# Patient Record
Sex: Male | Born: 1970
Health system: Southern US, Community
[De-identification: ages and names within clinical notes are randomized; demographics above are authoritative.]

## PROBLEM LIST (undated history)

## (undated) DIAGNOSIS — M3130 Wegener's granulomatosis without renal involvement: Secondary | ICD-10-CM

## (undated) DIAGNOSIS — I499 Cardiac arrhythmia, unspecified: Secondary | ICD-10-CM

## (undated) DIAGNOSIS — Z6828 Body mass index (BMI) 28.0-28.9, adult: Secondary | ICD-10-CM

## (undated) DIAGNOSIS — R519 Headache, unspecified: Secondary | ICD-10-CM

## (undated) HISTORY — PX: LUNG BIOPSY: SHX232

## (undated) HISTORY — DX: Body mass index (BMI) 28.0-28.9, adult: Z68.28

## (undated) HISTORY — PX: WISDOM TOOTH EXTRACTION: SHX21

---

## 2015-03-25 DIAGNOSIS — H61002 Unspecified perichondritis of left external ear: Secondary | ICD-10-CM | POA: Diagnosis not present

## 2015-05-09 MED FILL — CHLORHEXIDINE 0.12% RINSE: 0.12 | 15 days supply | Qty: 473 | Fill #0

## 2015-05-09 MED FILL — AMOXICILLIN 500 MG CAPSULE: 500 | 7 days supply | Qty: 21 | Fill #0

## 2015-05-09 MED FILL — HYDROCODON-APAP 7.5-325: 7.5-325 | 3 days supply | Qty: 12 | Fill #0

## 2015-06-09 DIAGNOSIS — H524 Presbyopia: Secondary | ICD-10-CM | POA: Diagnosis not present

## 2015-06-23 DIAGNOSIS — Z125 Encounter for screening for malignant neoplasm of prostate: Secondary | ICD-10-CM | POA: Diagnosis not present

## 2015-06-23 DIAGNOSIS — E538 Deficiency of other specified B group vitamins: Secondary | ICD-10-CM | POA: Diagnosis not present

## 2015-06-23 DIAGNOSIS — Z0000 Encounter for general adult medical examination without abnormal findings: Secondary | ICD-10-CM | POA: Diagnosis not present

## 2015-06-24 DIAGNOSIS — J301 Allergic rhinitis due to pollen: Secondary | ICD-10-CM | POA: Diagnosis not present

## 2015-06-24 DIAGNOSIS — J3081 Allergic rhinitis due to animal (cat) (dog) hair and dander: Secondary | ICD-10-CM | POA: Diagnosis not present

## 2015-06-24 DIAGNOSIS — J3089 Other allergic rhinitis: Secondary | ICD-10-CM | POA: Diagnosis not present

## 2015-06-24 DIAGNOSIS — R05 Cough: Secondary | ICD-10-CM | POA: Diagnosis not present

## 2015-06-24 MED FILL — LEVOCETIRIZINE 5 MG TABLET: 5 | 30 days supply | Qty: 30 | Fill #0

## 2015-06-24 MED FILL — MOMETASONE FUROATE 0.1% CRM: 0.1 | 30 days supply | Qty: 90 | Fill #0

## 2015-06-24 MED FILL — FLUTICASONE PROP 50 MCG SPR: 50 | 30 days supply | Qty: 16 | Fill #0

## 2015-07-03 MED FILL — EPINEPHRINE 0.3 MG AUTO-INJ: 0.3 | 20 days supply | Qty: 2 | Fill #0

## 2015-07-09 DIAGNOSIS — H40003 Preglaucoma, unspecified, bilateral: Secondary | ICD-10-CM | POA: Diagnosis not present

## 2015-07-15 DIAGNOSIS — J3089 Other allergic rhinitis: Secondary | ICD-10-CM | POA: Diagnosis not present

## 2015-07-15 DIAGNOSIS — J3081 Allergic rhinitis due to animal (cat) (dog) hair and dander: Secondary | ICD-10-CM | POA: Diagnosis not present

## 2015-07-15 DIAGNOSIS — J301 Allergic rhinitis due to pollen: Secondary | ICD-10-CM | POA: Diagnosis not present

## 2015-07-22 DIAGNOSIS — J3081 Allergic rhinitis due to animal (cat) (dog) hair and dander: Secondary | ICD-10-CM | POA: Diagnosis not present

## 2015-07-22 DIAGNOSIS — J301 Allergic rhinitis due to pollen: Secondary | ICD-10-CM | POA: Diagnosis not present

## 2015-07-22 DIAGNOSIS — J3089 Other allergic rhinitis: Secondary | ICD-10-CM | POA: Diagnosis not present

## 2015-07-25 DIAGNOSIS — J301 Allergic rhinitis due to pollen: Secondary | ICD-10-CM | POA: Diagnosis not present

## 2015-07-25 DIAGNOSIS — J3089 Other allergic rhinitis: Secondary | ICD-10-CM | POA: Diagnosis not present

## 2015-07-25 DIAGNOSIS — J3081 Allergic rhinitis due to animal (cat) (dog) hair and dander: Secondary | ICD-10-CM | POA: Diagnosis not present

## 2015-07-29 DIAGNOSIS — J301 Allergic rhinitis due to pollen: Secondary | ICD-10-CM | POA: Diagnosis not present

## 2015-07-29 DIAGNOSIS — J3089 Other allergic rhinitis: Secondary | ICD-10-CM | POA: Diagnosis not present

## 2015-07-29 DIAGNOSIS — J3081 Allergic rhinitis due to animal (cat) (dog) hair and dander: Secondary | ICD-10-CM | POA: Diagnosis not present

## 2015-07-31 DIAGNOSIS — J3081 Allergic rhinitis due to animal (cat) (dog) hair and dander: Secondary | ICD-10-CM | POA: Diagnosis not present

## 2015-07-31 DIAGNOSIS — J3089 Other allergic rhinitis: Secondary | ICD-10-CM | POA: Diagnosis not present

## 2015-07-31 DIAGNOSIS — J301 Allergic rhinitis due to pollen: Secondary | ICD-10-CM | POA: Diagnosis not present

## 2015-08-06 DIAGNOSIS — J301 Allergic rhinitis due to pollen: Secondary | ICD-10-CM | POA: Diagnosis not present

## 2015-08-06 DIAGNOSIS — J3089 Other allergic rhinitis: Secondary | ICD-10-CM | POA: Diagnosis not present

## 2015-08-06 DIAGNOSIS — J3081 Allergic rhinitis due to animal (cat) (dog) hair and dander: Secondary | ICD-10-CM | POA: Diagnosis not present

## 2015-08-13 DIAGNOSIS — J3081 Allergic rhinitis due to animal (cat) (dog) hair and dander: Secondary | ICD-10-CM | POA: Diagnosis not present

## 2015-08-13 DIAGNOSIS — J301 Allergic rhinitis due to pollen: Secondary | ICD-10-CM | POA: Diagnosis not present

## 2015-08-13 DIAGNOSIS — J3089 Other allergic rhinitis: Secondary | ICD-10-CM | POA: Diagnosis not present

## 2015-08-20 DIAGNOSIS — J301 Allergic rhinitis due to pollen: Secondary | ICD-10-CM | POA: Diagnosis not present

## 2015-08-20 DIAGNOSIS — J3089 Other allergic rhinitis: Secondary | ICD-10-CM | POA: Diagnosis not present

## 2015-08-20 DIAGNOSIS — J3081 Allergic rhinitis due to animal (cat) (dog) hair and dander: Secondary | ICD-10-CM | POA: Diagnosis not present

## 2015-08-21 DIAGNOSIS — G5600 Carpal tunnel syndrome, unspecified upper limb: Secondary | ICD-10-CM | POA: Diagnosis not present

## 2015-08-25 MED FILL — SERTRALINE HCL 50 MG TABLET: 50 | 90 days supply | Qty: 90 | Fill #0

## 2015-08-25 MED FILL — ETODOLAC 400 MG TABLET: 400 | 30 days supply | Qty: 60 | Fill #0

## 2015-08-27 DIAGNOSIS — J301 Allergic rhinitis due to pollen: Secondary | ICD-10-CM | POA: Diagnosis not present

## 2015-08-27 DIAGNOSIS — J3089 Other allergic rhinitis: Secondary | ICD-10-CM | POA: Diagnosis not present

## 2015-08-27 DIAGNOSIS — J3081 Allergic rhinitis due to animal (cat) (dog) hair and dander: Secondary | ICD-10-CM | POA: Diagnosis not present

## 2015-09-03 DIAGNOSIS — J3089 Other allergic rhinitis: Secondary | ICD-10-CM | POA: Diagnosis not present

## 2015-09-03 DIAGNOSIS — J3081 Allergic rhinitis due to animal (cat) (dog) hair and dander: Secondary | ICD-10-CM | POA: Diagnosis not present

## 2015-09-03 DIAGNOSIS — J301 Allergic rhinitis due to pollen: Secondary | ICD-10-CM | POA: Diagnosis not present

## 2015-09-10 DIAGNOSIS — J301 Allergic rhinitis due to pollen: Secondary | ICD-10-CM | POA: Diagnosis not present

## 2015-09-10 DIAGNOSIS — J3081 Allergic rhinitis due to animal (cat) (dog) hair and dander: Secondary | ICD-10-CM | POA: Diagnosis not present

## 2015-09-10 DIAGNOSIS — J3089 Other allergic rhinitis: Secondary | ICD-10-CM | POA: Diagnosis not present

## 2015-09-11 MED FILL — FLUTICASONE PROP 50 MCG SPR: 50 | 30 days supply | Qty: 16 | Fill #1

## 2015-09-16 DIAGNOSIS — J3081 Allergic rhinitis due to animal (cat) (dog) hair and dander: Secondary | ICD-10-CM | POA: Diagnosis not present

## 2015-09-16 DIAGNOSIS — J3089 Other allergic rhinitis: Secondary | ICD-10-CM | POA: Diagnosis not present

## 2015-09-16 DIAGNOSIS — J301 Allergic rhinitis due to pollen: Secondary | ICD-10-CM | POA: Diagnosis not present

## 2015-09-19 DIAGNOSIS — J3081 Allergic rhinitis due to animal (cat) (dog) hair and dander: Secondary | ICD-10-CM | POA: Diagnosis not present

## 2015-09-19 DIAGNOSIS — J3089 Other allergic rhinitis: Secondary | ICD-10-CM | POA: Diagnosis not present

## 2015-09-19 DIAGNOSIS — J301 Allergic rhinitis due to pollen: Secondary | ICD-10-CM | POA: Diagnosis not present

## 2015-09-23 DIAGNOSIS — J301 Allergic rhinitis due to pollen: Secondary | ICD-10-CM | POA: Diagnosis not present

## 2015-09-23 DIAGNOSIS — J3089 Other allergic rhinitis: Secondary | ICD-10-CM | POA: Diagnosis not present

## 2015-09-23 DIAGNOSIS — J3081 Allergic rhinitis due to animal (cat) (dog) hair and dander: Secondary | ICD-10-CM | POA: Diagnosis not present

## 2015-09-26 DIAGNOSIS — J3089 Other allergic rhinitis: Secondary | ICD-10-CM | POA: Diagnosis not present

## 2015-09-26 DIAGNOSIS — J301 Allergic rhinitis due to pollen: Secondary | ICD-10-CM | POA: Diagnosis not present

## 2015-09-26 DIAGNOSIS — J3081 Allergic rhinitis due to animal (cat) (dog) hair and dander: Secondary | ICD-10-CM | POA: Diagnosis not present

## 2015-09-30 DIAGNOSIS — J3089 Other allergic rhinitis: Secondary | ICD-10-CM | POA: Diagnosis not present

## 2015-09-30 DIAGNOSIS — J3081 Allergic rhinitis due to animal (cat) (dog) hair and dander: Secondary | ICD-10-CM | POA: Diagnosis not present

## 2015-09-30 DIAGNOSIS — J301 Allergic rhinitis due to pollen: Secondary | ICD-10-CM | POA: Diagnosis not present

## 2015-10-02 DIAGNOSIS — J3089 Other allergic rhinitis: Secondary | ICD-10-CM | POA: Diagnosis not present

## 2015-10-02 DIAGNOSIS — J301 Allergic rhinitis due to pollen: Secondary | ICD-10-CM | POA: Diagnosis not present

## 2015-10-02 DIAGNOSIS — J3081 Allergic rhinitis due to animal (cat) (dog) hair and dander: Secondary | ICD-10-CM | POA: Diagnosis not present

## 2015-10-09 DIAGNOSIS — J3081 Allergic rhinitis due to animal (cat) (dog) hair and dander: Secondary | ICD-10-CM | POA: Diagnosis not present

## 2015-10-09 DIAGNOSIS — J3089 Other allergic rhinitis: Secondary | ICD-10-CM | POA: Diagnosis not present

## 2015-10-09 DIAGNOSIS — J301 Allergic rhinitis due to pollen: Secondary | ICD-10-CM | POA: Diagnosis not present

## 2015-10-17 DIAGNOSIS — J301 Allergic rhinitis due to pollen: Secondary | ICD-10-CM | POA: Diagnosis not present

## 2015-10-17 DIAGNOSIS — J3089 Other allergic rhinitis: Secondary | ICD-10-CM | POA: Diagnosis not present

## 2015-10-20 DIAGNOSIS — J3089 Other allergic rhinitis: Secondary | ICD-10-CM | POA: Diagnosis not present

## 2015-10-20 DIAGNOSIS — J301 Allergic rhinitis due to pollen: Secondary | ICD-10-CM | POA: Diagnosis not present

## 2015-10-20 DIAGNOSIS — J3081 Allergic rhinitis due to animal (cat) (dog) hair and dander: Secondary | ICD-10-CM | POA: Diagnosis not present

## 2015-10-22 DIAGNOSIS — J3089 Other allergic rhinitis: Secondary | ICD-10-CM | POA: Diagnosis not present

## 2015-10-22 DIAGNOSIS — J3081 Allergic rhinitis due to animal (cat) (dog) hair and dander: Secondary | ICD-10-CM | POA: Diagnosis not present

## 2015-10-22 DIAGNOSIS — J301 Allergic rhinitis due to pollen: Secondary | ICD-10-CM | POA: Diagnosis not present

## 2015-10-28 MED FILL — FLUTICASONE PROP 50 MCG SPR: 50 | 60 days supply | Qty: 32 | Fill #2

## 2015-10-29 DIAGNOSIS — J3089 Other allergic rhinitis: Secondary | ICD-10-CM | POA: Diagnosis not present

## 2015-10-29 DIAGNOSIS — J301 Allergic rhinitis due to pollen: Secondary | ICD-10-CM | POA: Diagnosis not present

## 2015-10-29 DIAGNOSIS — J3081 Allergic rhinitis due to animal (cat) (dog) hair and dander: Secondary | ICD-10-CM | POA: Diagnosis not present

## 2015-10-31 DIAGNOSIS — J3089 Other allergic rhinitis: Secondary | ICD-10-CM | POA: Diagnosis not present

## 2015-10-31 DIAGNOSIS — J301 Allergic rhinitis due to pollen: Secondary | ICD-10-CM | POA: Diagnosis not present

## 2015-10-31 DIAGNOSIS — J3081 Allergic rhinitis due to animal (cat) (dog) hair and dander: Secondary | ICD-10-CM | POA: Diagnosis not present

## 2015-11-05 DIAGNOSIS — J301 Allergic rhinitis due to pollen: Secondary | ICD-10-CM | POA: Diagnosis not present

## 2015-11-05 DIAGNOSIS — J3081 Allergic rhinitis due to animal (cat) (dog) hair and dander: Secondary | ICD-10-CM | POA: Diagnosis not present

## 2015-11-05 DIAGNOSIS — J3089 Other allergic rhinitis: Secondary | ICD-10-CM | POA: Diagnosis not present

## 2015-11-24 DIAGNOSIS — J301 Allergic rhinitis due to pollen: Secondary | ICD-10-CM | POA: Diagnosis not present

## 2015-11-24 DIAGNOSIS — J3089 Other allergic rhinitis: Secondary | ICD-10-CM | POA: Diagnosis not present

## 2015-11-24 DIAGNOSIS — J3081 Allergic rhinitis due to animal (cat) (dog) hair and dander: Secondary | ICD-10-CM | POA: Diagnosis not present

## 2015-12-09 DIAGNOSIS — J3089 Other allergic rhinitis: Secondary | ICD-10-CM | POA: Diagnosis not present

## 2015-12-09 DIAGNOSIS — J301 Allergic rhinitis due to pollen: Secondary | ICD-10-CM | POA: Diagnosis not present

## 2015-12-09 DIAGNOSIS — J3081 Allergic rhinitis due to animal (cat) (dog) hair and dander: Secondary | ICD-10-CM | POA: Diagnosis not present

## 2015-12-29 DIAGNOSIS — J301 Allergic rhinitis due to pollen: Secondary | ICD-10-CM | POA: Diagnosis not present

## 2015-12-29 DIAGNOSIS — J3081 Allergic rhinitis due to animal (cat) (dog) hair and dander: Secondary | ICD-10-CM | POA: Diagnosis not present

## 2015-12-29 DIAGNOSIS — J3089 Other allergic rhinitis: Secondary | ICD-10-CM | POA: Diagnosis not present

## 2015-12-31 DIAGNOSIS — J301 Allergic rhinitis due to pollen: Secondary | ICD-10-CM | POA: Diagnosis not present

## 2016-01-05 DIAGNOSIS — J3081 Allergic rhinitis due to animal (cat) (dog) hair and dander: Secondary | ICD-10-CM | POA: Diagnosis not present

## 2016-01-05 DIAGNOSIS — J3089 Other allergic rhinitis: Secondary | ICD-10-CM | POA: Diagnosis not present

## 2016-01-06 DIAGNOSIS — R21 Rash and other nonspecific skin eruption: Secondary | ICD-10-CM | POA: Diagnosis not present

## 2016-01-06 DIAGNOSIS — J3081 Allergic rhinitis due to animal (cat) (dog) hair and dander: Secondary | ICD-10-CM | POA: Diagnosis not present

## 2016-01-06 DIAGNOSIS — J301 Allergic rhinitis due to pollen: Secondary | ICD-10-CM | POA: Diagnosis not present

## 2016-01-06 DIAGNOSIS — J3089 Other allergic rhinitis: Secondary | ICD-10-CM | POA: Diagnosis not present

## 2016-01-20 DIAGNOSIS — J3089 Other allergic rhinitis: Secondary | ICD-10-CM | POA: Diagnosis not present

## 2016-01-20 DIAGNOSIS — J3081 Allergic rhinitis due to animal (cat) (dog) hair and dander: Secondary | ICD-10-CM | POA: Diagnosis not present

## 2016-01-20 DIAGNOSIS — J301 Allergic rhinitis due to pollen: Secondary | ICD-10-CM | POA: Diagnosis not present

## 2016-02-06 DIAGNOSIS — J3081 Allergic rhinitis due to animal (cat) (dog) hair and dander: Secondary | ICD-10-CM | POA: Diagnosis not present

## 2016-02-06 DIAGNOSIS — J3089 Other allergic rhinitis: Secondary | ICD-10-CM | POA: Diagnosis not present

## 2016-02-06 DIAGNOSIS — J301 Allergic rhinitis due to pollen: Secondary | ICD-10-CM | POA: Diagnosis not present

## 2016-02-20 DIAGNOSIS — H524 Presbyopia: Secondary | ICD-10-CM | POA: Diagnosis not present

## 2016-03-03 DIAGNOSIS — J3089 Other allergic rhinitis: Secondary | ICD-10-CM | POA: Diagnosis not present

## 2016-03-03 DIAGNOSIS — J301 Allergic rhinitis due to pollen: Secondary | ICD-10-CM | POA: Diagnosis not present

## 2016-03-03 DIAGNOSIS — J3081 Allergic rhinitis due to animal (cat) (dog) hair and dander: Secondary | ICD-10-CM | POA: Diagnosis not present

## 2016-03-24 DIAGNOSIS — J3089 Other allergic rhinitis: Secondary | ICD-10-CM | POA: Diagnosis not present

## 2016-03-24 DIAGNOSIS — J301 Allergic rhinitis due to pollen: Secondary | ICD-10-CM | POA: Diagnosis not present

## 2016-03-24 DIAGNOSIS — J3081 Allergic rhinitis due to animal (cat) (dog) hair and dander: Secondary | ICD-10-CM | POA: Diagnosis not present

## 2016-03-24 MED FILL — FLUTICASONE PROP 50 MCG SPR: 50 | 90 days supply | Qty: 48 | Fill #0

## 2016-03-24 MED FILL — EPINEPHRINE 0.3 MG AUTO-INJ: 0.3 | 20 days supply | Qty: 2 | Fill #1

## 2016-03-25 DIAGNOSIS — R222 Localized swelling, mass and lump, trunk: Secondary | ICD-10-CM | POA: Diagnosis not present

## 2016-04-01 DIAGNOSIS — J3081 Allergic rhinitis due to animal (cat) (dog) hair and dander: Secondary | ICD-10-CM | POA: Diagnosis not present

## 2016-04-01 DIAGNOSIS — J301 Allergic rhinitis due to pollen: Secondary | ICD-10-CM | POA: Diagnosis not present

## 2016-04-01 DIAGNOSIS — J3089 Other allergic rhinitis: Secondary | ICD-10-CM | POA: Diagnosis not present

## 2016-04-14 DIAGNOSIS — J301 Allergic rhinitis due to pollen: Secondary | ICD-10-CM | POA: Diagnosis not present

## 2016-04-14 DIAGNOSIS — J3089 Other allergic rhinitis: Secondary | ICD-10-CM | POA: Diagnosis not present

## 2016-04-14 DIAGNOSIS — J3081 Allergic rhinitis due to animal (cat) (dog) hair and dander: Secondary | ICD-10-CM | POA: Diagnosis not present

## 2016-04-23 DIAGNOSIS — J3089 Other allergic rhinitis: Secondary | ICD-10-CM | POA: Diagnosis not present

## 2016-04-23 DIAGNOSIS — J3081 Allergic rhinitis due to animal (cat) (dog) hair and dander: Secondary | ICD-10-CM | POA: Diagnosis not present

## 2016-04-23 DIAGNOSIS — J301 Allergic rhinitis due to pollen: Secondary | ICD-10-CM | POA: Diagnosis not present

## 2016-05-04 DIAGNOSIS — J3089 Other allergic rhinitis: Secondary | ICD-10-CM | POA: Diagnosis not present

## 2016-05-04 DIAGNOSIS — J3081 Allergic rhinitis due to animal (cat) (dog) hair and dander: Secondary | ICD-10-CM | POA: Diagnosis not present

## 2016-05-04 DIAGNOSIS — J301 Allergic rhinitis due to pollen: Secondary | ICD-10-CM | POA: Diagnosis not present

## 2016-05-11 DIAGNOSIS — J3081 Allergic rhinitis due to animal (cat) (dog) hair and dander: Secondary | ICD-10-CM | POA: Diagnosis not present

## 2016-05-11 DIAGNOSIS — J301 Allergic rhinitis due to pollen: Secondary | ICD-10-CM | POA: Diagnosis not present

## 2016-05-11 DIAGNOSIS — J3089 Other allergic rhinitis: Secondary | ICD-10-CM | POA: Diagnosis not present

## 2016-05-17 DIAGNOSIS — J301 Allergic rhinitis due to pollen: Secondary | ICD-10-CM | POA: Diagnosis not present

## 2016-05-17 DIAGNOSIS — J3081 Allergic rhinitis due to animal (cat) (dog) hair and dander: Secondary | ICD-10-CM | POA: Diagnosis not present

## 2016-05-17 DIAGNOSIS — J3089 Other allergic rhinitis: Secondary | ICD-10-CM | POA: Diagnosis not present

## 2016-05-27 DIAGNOSIS — J301 Allergic rhinitis due to pollen: Secondary | ICD-10-CM | POA: Diagnosis not present

## 2016-05-27 DIAGNOSIS — J3089 Other allergic rhinitis: Secondary | ICD-10-CM | POA: Diagnosis not present

## 2016-05-27 DIAGNOSIS — J3081 Allergic rhinitis due to animal (cat) (dog) hair and dander: Secondary | ICD-10-CM | POA: Diagnosis not present

## 2016-06-01 DIAGNOSIS — L814 Other melanin hyperpigmentation: Secondary | ICD-10-CM | POA: Diagnosis not present

## 2016-06-01 DIAGNOSIS — L821 Other seborrheic keratosis: Secondary | ICD-10-CM | POA: Diagnosis not present

## 2016-06-01 DIAGNOSIS — D225 Melanocytic nevi of trunk: Secondary | ICD-10-CM | POA: Diagnosis not present

## 2016-06-01 DIAGNOSIS — L570 Actinic keratosis: Secondary | ICD-10-CM | POA: Diagnosis not present

## 2016-06-02 DIAGNOSIS — J301 Allergic rhinitis due to pollen: Secondary | ICD-10-CM | POA: Diagnosis not present

## 2016-06-02 DIAGNOSIS — J3089 Other allergic rhinitis: Secondary | ICD-10-CM | POA: Diagnosis not present

## 2016-06-02 DIAGNOSIS — J3081 Allergic rhinitis due to animal (cat) (dog) hair and dander: Secondary | ICD-10-CM | POA: Diagnosis not present

## 2016-06-08 DIAGNOSIS — J301 Allergic rhinitis due to pollen: Secondary | ICD-10-CM | POA: Diagnosis not present

## 2016-06-08 DIAGNOSIS — J3081 Allergic rhinitis due to animal (cat) (dog) hair and dander: Secondary | ICD-10-CM | POA: Diagnosis not present

## 2016-06-08 DIAGNOSIS — J3089 Other allergic rhinitis: Secondary | ICD-10-CM | POA: Diagnosis not present

## 2016-06-21 DIAGNOSIS — J3089 Other allergic rhinitis: Secondary | ICD-10-CM | POA: Diagnosis not present

## 2016-06-21 DIAGNOSIS — J301 Allergic rhinitis due to pollen: Secondary | ICD-10-CM | POA: Diagnosis not present

## 2016-06-21 DIAGNOSIS — J3081 Allergic rhinitis due to animal (cat) (dog) hair and dander: Secondary | ICD-10-CM | POA: Diagnosis not present

## 2016-07-15 DIAGNOSIS — J3089 Other allergic rhinitis: Secondary | ICD-10-CM | POA: Diagnosis not present

## 2016-07-15 DIAGNOSIS — J301 Allergic rhinitis due to pollen: Secondary | ICD-10-CM | POA: Diagnosis not present

## 2016-07-21 DIAGNOSIS — J3089 Other allergic rhinitis: Secondary | ICD-10-CM | POA: Diagnosis not present

## 2016-07-21 DIAGNOSIS — J301 Allergic rhinitis due to pollen: Secondary | ICD-10-CM | POA: Diagnosis not present

## 2016-07-21 DIAGNOSIS — J3081 Allergic rhinitis due to animal (cat) (dog) hair and dander: Secondary | ICD-10-CM | POA: Diagnosis not present

## 2016-08-03 DIAGNOSIS — J301 Allergic rhinitis due to pollen: Secondary | ICD-10-CM | POA: Diagnosis not present

## 2016-08-03 DIAGNOSIS — J3081 Allergic rhinitis due to animal (cat) (dog) hair and dander: Secondary | ICD-10-CM | POA: Diagnosis not present

## 2016-08-03 DIAGNOSIS — J3089 Other allergic rhinitis: Secondary | ICD-10-CM | POA: Diagnosis not present

## 2016-08-23 DIAGNOSIS — J3089 Other allergic rhinitis: Secondary | ICD-10-CM | POA: Diagnosis not present

## 2016-08-23 DIAGNOSIS — J3081 Allergic rhinitis due to animal (cat) (dog) hair and dander: Secondary | ICD-10-CM | POA: Diagnosis not present

## 2016-08-23 DIAGNOSIS — Z125 Encounter for screening for malignant neoplasm of prostate: Secondary | ICD-10-CM | POA: Diagnosis not present

## 2016-08-23 DIAGNOSIS — Z0000 Encounter for general adult medical examination without abnormal findings: Secondary | ICD-10-CM | POA: Diagnosis not present

## 2016-08-23 DIAGNOSIS — J301 Allergic rhinitis due to pollen: Secondary | ICD-10-CM | POA: Diagnosis not present

## 2016-08-23 DIAGNOSIS — E538 Deficiency of other specified B group vitamins: Secondary | ICD-10-CM | POA: Diagnosis not present

## 2016-09-06 DIAGNOSIS — J3081 Allergic rhinitis due to animal (cat) (dog) hair and dander: Secondary | ICD-10-CM | POA: Diagnosis not present

## 2016-09-06 DIAGNOSIS — J301 Allergic rhinitis due to pollen: Secondary | ICD-10-CM | POA: Diagnosis not present

## 2016-09-06 DIAGNOSIS — J3089 Other allergic rhinitis: Secondary | ICD-10-CM | POA: Diagnosis not present

## 2016-09-22 DIAGNOSIS — J301 Allergic rhinitis due to pollen: Secondary | ICD-10-CM | POA: Diagnosis not present

## 2016-09-22 DIAGNOSIS — J3089 Other allergic rhinitis: Secondary | ICD-10-CM | POA: Diagnosis not present

## 2016-09-22 DIAGNOSIS — J3081 Allergic rhinitis due to animal (cat) (dog) hair and dander: Secondary | ICD-10-CM | POA: Diagnosis not present

## 2016-10-18 DIAGNOSIS — J301 Allergic rhinitis due to pollen: Secondary | ICD-10-CM | POA: Diagnosis not present

## 2016-10-18 DIAGNOSIS — J3081 Allergic rhinitis due to animal (cat) (dog) hair and dander: Secondary | ICD-10-CM | POA: Diagnosis not present

## 2016-10-18 DIAGNOSIS — J3089 Other allergic rhinitis: Secondary | ICD-10-CM | POA: Diagnosis not present

## 2016-10-25 DIAGNOSIS — J301 Allergic rhinitis due to pollen: Secondary | ICD-10-CM | POA: Diagnosis not present

## 2016-10-25 DIAGNOSIS — J3089 Other allergic rhinitis: Secondary | ICD-10-CM | POA: Diagnosis not present

## 2016-10-25 DIAGNOSIS — J3081 Allergic rhinitis due to animal (cat) (dog) hair and dander: Secondary | ICD-10-CM | POA: Diagnosis not present

## 2016-10-28 DIAGNOSIS — J301 Allergic rhinitis due to pollen: Secondary | ICD-10-CM | POA: Diagnosis not present

## 2016-10-28 DIAGNOSIS — J3089 Other allergic rhinitis: Secondary | ICD-10-CM | POA: Diagnosis not present

## 2016-10-28 DIAGNOSIS — J3081 Allergic rhinitis due to animal (cat) (dog) hair and dander: Secondary | ICD-10-CM | POA: Diagnosis not present

## 2016-11-01 DIAGNOSIS — E538 Deficiency of other specified B group vitamins: Secondary | ICD-10-CM | POA: Diagnosis not present

## 2016-11-05 MED FILL — FLUTICASONE PROP 50 MCG SPR: 50 | 90 days supply | Qty: 48 | Fill #1

## 2016-11-17 DIAGNOSIS — J3089 Other allergic rhinitis: Secondary | ICD-10-CM | POA: Diagnosis not present

## 2016-11-17 DIAGNOSIS — J3081 Allergic rhinitis due to animal (cat) (dog) hair and dander: Secondary | ICD-10-CM | POA: Diagnosis not present

## 2016-11-17 DIAGNOSIS — J301 Allergic rhinitis due to pollen: Secondary | ICD-10-CM | POA: Diagnosis not present

## 2016-12-13 DIAGNOSIS — J3081 Allergic rhinitis due to animal (cat) (dog) hair and dander: Secondary | ICD-10-CM | POA: Diagnosis not present

## 2016-12-13 DIAGNOSIS — J3089 Other allergic rhinitis: Secondary | ICD-10-CM | POA: Diagnosis not present

## 2016-12-13 DIAGNOSIS — J301 Allergic rhinitis due to pollen: Secondary | ICD-10-CM | POA: Diagnosis not present

## 2016-12-20 DIAGNOSIS — J301 Allergic rhinitis due to pollen: Secondary | ICD-10-CM | POA: Diagnosis not present

## 2016-12-20 DIAGNOSIS — J3081 Allergic rhinitis due to animal (cat) (dog) hair and dander: Secondary | ICD-10-CM | POA: Diagnosis not present

## 2016-12-20 DIAGNOSIS — J3089 Other allergic rhinitis: Secondary | ICD-10-CM | POA: Diagnosis not present

## 2016-12-21 DIAGNOSIS — J301 Allergic rhinitis due to pollen: Secondary | ICD-10-CM | POA: Diagnosis not present

## 2016-12-22 DIAGNOSIS — J3089 Other allergic rhinitis: Secondary | ICD-10-CM | POA: Diagnosis not present

## 2016-12-22 DIAGNOSIS — J3081 Allergic rhinitis due to animal (cat) (dog) hair and dander: Secondary | ICD-10-CM | POA: Diagnosis not present

## 2017-01-05 DIAGNOSIS — J3089 Other allergic rhinitis: Secondary | ICD-10-CM | POA: Diagnosis not present

## 2017-01-05 DIAGNOSIS — R21 Rash and other nonspecific skin eruption: Secondary | ICD-10-CM | POA: Diagnosis not present

## 2017-01-05 DIAGNOSIS — J301 Allergic rhinitis due to pollen: Secondary | ICD-10-CM | POA: Diagnosis not present

## 2017-01-05 DIAGNOSIS — J3081 Allergic rhinitis due to animal (cat) (dog) hair and dander: Secondary | ICD-10-CM | POA: Diagnosis not present

## 2017-01-14 DIAGNOSIS — J3089 Other allergic rhinitis: Secondary | ICD-10-CM | POA: Diagnosis not present

## 2017-01-14 DIAGNOSIS — J301 Allergic rhinitis due to pollen: Secondary | ICD-10-CM | POA: Diagnosis not present

## 2017-01-14 DIAGNOSIS — J3081 Allergic rhinitis due to animal (cat) (dog) hair and dander: Secondary | ICD-10-CM | POA: Diagnosis not present

## 2017-01-25 DIAGNOSIS — J301 Allergic rhinitis due to pollen: Secondary | ICD-10-CM | POA: Diagnosis not present

## 2017-01-25 DIAGNOSIS — J3081 Allergic rhinitis due to animal (cat) (dog) hair and dander: Secondary | ICD-10-CM | POA: Diagnosis not present

## 2017-01-25 DIAGNOSIS — J3089 Other allergic rhinitis: Secondary | ICD-10-CM | POA: Diagnosis not present

## 2017-03-30 DIAGNOSIS — J3081 Allergic rhinitis due to animal (cat) (dog) hair and dander: Secondary | ICD-10-CM | POA: Diagnosis not present

## 2017-03-30 DIAGNOSIS — J3089 Other allergic rhinitis: Secondary | ICD-10-CM | POA: Diagnosis not present

## 2017-03-30 DIAGNOSIS — J301 Allergic rhinitis due to pollen: Secondary | ICD-10-CM | POA: Diagnosis not present

## 2017-04-12 DIAGNOSIS — J3081 Allergic rhinitis due to animal (cat) (dog) hair and dander: Secondary | ICD-10-CM | POA: Diagnosis not present

## 2017-04-12 DIAGNOSIS — J301 Allergic rhinitis due to pollen: Secondary | ICD-10-CM | POA: Diagnosis not present

## 2017-04-12 DIAGNOSIS — J3089 Other allergic rhinitis: Secondary | ICD-10-CM | POA: Diagnosis not present

## 2017-05-02 DIAGNOSIS — J3089 Other allergic rhinitis: Secondary | ICD-10-CM | POA: Diagnosis not present

## 2017-05-02 DIAGNOSIS — J3081 Allergic rhinitis due to animal (cat) (dog) hair and dander: Secondary | ICD-10-CM | POA: Diagnosis not present

## 2017-05-02 DIAGNOSIS — J301 Allergic rhinitis due to pollen: Secondary | ICD-10-CM | POA: Diagnosis not present

## 2017-06-02 MED FILL — AMOXICILLIN 875 MG TABLET: 875 | 10 days supply | Qty: 20 | Fill #0

## 2017-06-09 DIAGNOSIS — D2239 Melanocytic nevi of other parts of face: Secondary | ICD-10-CM | POA: Diagnosis not present

## 2017-06-09 DIAGNOSIS — L814 Other melanin hyperpigmentation: Secondary | ICD-10-CM | POA: Diagnosis not present

## 2017-06-09 DIAGNOSIS — D225 Melanocytic nevi of trunk: Secondary | ICD-10-CM | POA: Diagnosis not present

## 2017-06-09 DIAGNOSIS — D485 Neoplasm of uncertain behavior of skin: Secondary | ICD-10-CM | POA: Diagnosis not present

## 2017-06-09 DIAGNOSIS — D1801 Hemangioma of skin and subcutaneous tissue: Secondary | ICD-10-CM | POA: Diagnosis not present

## 2017-06-09 DIAGNOSIS — L821 Other seborrheic keratosis: Secondary | ICD-10-CM | POA: Diagnosis not present

## 2017-06-09 DIAGNOSIS — D2339 Other benign neoplasm of skin of other parts of face: Secondary | ICD-10-CM | POA: Diagnosis not present

## 2017-07-11 DIAGNOSIS — J309 Allergic rhinitis, unspecified: Secondary | ICD-10-CM | POA: Diagnosis not present

## 2017-07-11 DIAGNOSIS — J340 Abscess, furuncle and carbuncle of nose: Secondary | ICD-10-CM | POA: Diagnosis not present

## 2017-07-11 MED FILL — predniSONE 10 MG (48) TBPK: 10 | 12 days supply | Qty: 48 | Fill #0

## 2017-07-11 MED FILL — SULFAMETHOXAZOLE-TMP DS TAB: 800-160 | 14 days supply | Qty: 28 | Fill #0

## 2017-07-11 MED FILL — GENTAMICIN 0.1% OINTMENT: 0.1 | 10 days supply | Qty: 30 | Fill #0

## 2017-07-25 DIAGNOSIS — J340 Abscess, furuncle and carbuncle of nose: Secondary | ICD-10-CM | POA: Diagnosis not present

## 2017-09-15 DIAGNOSIS — J340 Abscess, furuncle and carbuncle of nose: Secondary | ICD-10-CM | POA: Diagnosis not present

## 2017-09-15 DIAGNOSIS — M3130 Wegener's granulomatosis without renal involvement: Secondary | ICD-10-CM | POA: Diagnosis not present

## 2017-09-21 ENCOUNTER — Other Ambulatory Visit: Payer: Self-pay | Admitting: Unknown Physician Specialty

## 2017-09-21 DIAGNOSIS — R059 Cough, unspecified: Secondary | ICD-10-CM

## 2017-09-21 DIAGNOSIS — R05 Cough: Secondary | ICD-10-CM

## 2017-09-29 DIAGNOSIS — J340 Abscess, furuncle and carbuncle of nose: Secondary | ICD-10-CM | POA: Diagnosis not present

## 2017-09-29 DIAGNOSIS — L0390 Cellulitis, unspecified: Secondary | ICD-10-CM | POA: Diagnosis not present

## 2017-09-29 DIAGNOSIS — J349 Unspecified disorder of nose and nasal sinuses: Secondary | ICD-10-CM | POA: Diagnosis not present

## 2017-09-29 DIAGNOSIS — D385 Neoplasm of uncertain behavior of other respiratory organs: Secondary | ICD-10-CM | POA: Diagnosis not present

## 2017-09-29 DIAGNOSIS — M3130 Wegener's granulomatosis without renal involvement: Secondary | ICD-10-CM | POA: Diagnosis not present

## 2017-09-29 DIAGNOSIS — G4730 Sleep apnea, unspecified: Secondary | ICD-10-CM | POA: Diagnosis not present

## 2017-09-29 DIAGNOSIS — J309 Allergic rhinitis, unspecified: Secondary | ICD-10-CM | POA: Diagnosis not present

## 2017-10-19 ENCOUNTER — Other Ambulatory Visit: Payer: Self-pay | Admitting: Unknown Physician Specialty

## 2017-10-19 DIAGNOSIS — R0981 Nasal congestion: Secondary | ICD-10-CM

## 2017-10-19 DIAGNOSIS — J340 Abscess, furuncle and carbuncle of nose: Secondary | ICD-10-CM | POA: Diagnosis not present

## 2017-10-20 ENCOUNTER — Other Ambulatory Visit: Payer: Self-pay | Admitting: Unknown Physician Specialty

## 2017-10-20 ENCOUNTER — Ambulatory Visit (INDEPENDENT_AMBULATORY_CARE_PROVIDER_SITE_OTHER): Payer: 59

## 2017-10-20 DIAGNOSIS — R0981 Nasal congestion: Secondary | ICD-10-CM

## 2017-10-20 DIAGNOSIS — J320 Chronic maxillary sinusitis: Secondary | ICD-10-CM

## 2017-10-31 ENCOUNTER — Ambulatory Visit: Payer: Self-pay

## 2017-11-11 DIAGNOSIS — J3089 Other allergic rhinitis: Secondary | ICD-10-CM | POA: Diagnosis not present

## 2017-11-11 DIAGNOSIS — R21 Rash and other nonspecific skin eruption: Secondary | ICD-10-CM | POA: Diagnosis not present

## 2017-11-11 DIAGNOSIS — J301 Allergic rhinitis due to pollen: Secondary | ICD-10-CM | POA: Diagnosis not present

## 2017-11-11 DIAGNOSIS — J3081 Allergic rhinitis due to animal (cat) (dog) hair and dander: Secondary | ICD-10-CM | POA: Diagnosis not present

## 2017-11-11 MED FILL — MONTELUKAST SOD 10 MG TAB: 10 | 30 days supply | Qty: 30 | Fill #0

## 2017-12-08 DIAGNOSIS — J3089 Other allergic rhinitis: Secondary | ICD-10-CM | POA: Diagnosis not present

## 2017-12-08 DIAGNOSIS — J3081 Allergic rhinitis due to animal (cat) (dog) hair and dander: Secondary | ICD-10-CM | POA: Diagnosis not present

## 2017-12-08 DIAGNOSIS — R21 Rash and other nonspecific skin eruption: Secondary | ICD-10-CM | POA: Diagnosis not present

## 2017-12-08 DIAGNOSIS — J301 Allergic rhinitis due to pollen: Secondary | ICD-10-CM | POA: Diagnosis not present

## 2017-12-08 MED FILL — MOMETASONE FUROATE 0.1% CRE: 0.1 | 30 days supply | Qty: 90 | Fill #0

## 2017-12-14 MED FILL — MONTELUKAST SOD 10 MG TAB: 10 | 90 days supply | Qty: 90 | Fill #1

## 2017-12-21 MED FILL — XHANCE 93 MCG/ACT EXHU: 93 | 30 days supply | Qty: 16 | Fill #0

## 2018-02-15 MED FILL — XHANCE 93 MCG/ACT EXHU: 93 | 30 days supply | Qty: 16 | Fill #1

## 2018-03-06 DIAGNOSIS — Z0000 Encounter for general adult medical examination without abnormal findings: Secondary | ICD-10-CM | POA: Diagnosis not present

## 2018-03-06 DIAGNOSIS — Z125 Encounter for screening for malignant neoplasm of prostate: Secondary | ICD-10-CM | POA: Diagnosis not present

## 2018-03-13 DIAGNOSIS — Z0000 Encounter for general adult medical examination without abnormal findings: Secondary | ICD-10-CM | POA: Diagnosis not present

## 2018-03-13 DIAGNOSIS — R945 Abnormal results of liver function studies: Secondary | ICD-10-CM | POA: Diagnosis not present

## 2018-03-13 MED FILL — XHANCE 93 MCG/ACT EXHU: 93 | 30 days supply | Qty: 16 | Fill #2

## 2018-03-15 ENCOUNTER — Other Ambulatory Visit: Payer: Self-pay | Admitting: Internal Medicine

## 2018-03-15 DIAGNOSIS — R7989 Other specified abnormal findings of blood chemistry: Secondary | ICD-10-CM

## 2018-03-15 DIAGNOSIS — R945 Abnormal results of liver function studies: Secondary | ICD-10-CM

## 2018-04-18 MED FILL — XHANCE 93 MCG/ACT EXHU: 93 | 30 days supply | Qty: 16 | Fill #2

## 2018-06-02 DIAGNOSIS — J3081 Allergic rhinitis due to animal (cat) (dog) hair and dander: Secondary | ICD-10-CM | POA: Diagnosis not present

## 2018-06-02 DIAGNOSIS — J3089 Other allergic rhinitis: Secondary | ICD-10-CM | POA: Diagnosis not present

## 2018-06-02 DIAGNOSIS — R21 Rash and other nonspecific skin eruption: Secondary | ICD-10-CM | POA: Diagnosis not present

## 2018-06-02 DIAGNOSIS — J301 Allergic rhinitis due to pollen: Secondary | ICD-10-CM | POA: Diagnosis not present

## 2018-06-02 MED FILL — MOMETASONE FUROATE 0.1 % CR: 0.1 | 30 days supply | Qty: 90 | Fill #0

## 2018-06-05 MED FILL — XHANCE 93 MCG/ACT EXHU: 93 | 30 days supply | Qty: 16 | Fill #3

## 2018-07-26 MED FILL — XHANCE 93 MCG/ACT EXHU: 93 | 30 days supply | Qty: 16 | Fill #0

## 2018-08-09 DIAGNOSIS — J3081 Allergic rhinitis due to animal (cat) (dog) hair and dander: Secondary | ICD-10-CM | POA: Diagnosis not present

## 2018-08-09 DIAGNOSIS — J301 Allergic rhinitis due to pollen: Secondary | ICD-10-CM | POA: Diagnosis not present

## 2018-08-09 DIAGNOSIS — J019 Acute sinusitis, unspecified: Secondary | ICD-10-CM | POA: Diagnosis not present

## 2018-08-09 DIAGNOSIS — J3089 Other allergic rhinitis: Secondary | ICD-10-CM | POA: Diagnosis not present

## 2018-08-09 MED FILL — AZITHROMYCIN 250 MG TABS: 250 | 5 days supply | Qty: 6 | Fill #0

## 2018-08-09 MED FILL — predniSONE 10 MG (21) TBPK: 10 | 6 days supply | Qty: 21 | Fill #0

## 2018-08-15 MED FILL — predniSONE 10 MG (21) TBPK: 10 | 6 days supply | Qty: 21 | Fill #0

## 2018-09-07 MED FILL — METHYLPREDNISOLONE 4 MG TBP: 4 | 6 days supply | Qty: 21 | Fill #0

## 2018-09-08 DIAGNOSIS — J342 Deviated nasal septum: Secondary | ICD-10-CM | POA: Diagnosis not present

## 2018-09-08 DIAGNOSIS — H9012 Conductive hearing loss, unilateral, left ear, with unrestricted hearing on the contralateral side: Secondary | ICD-10-CM | POA: Diagnosis not present

## 2018-09-08 DIAGNOSIS — J310 Chronic rhinitis: Secondary | ICD-10-CM | POA: Diagnosis not present

## 2018-09-08 DIAGNOSIS — J343 Hypertrophy of nasal turbinates: Secondary | ICD-10-CM | POA: Diagnosis not present

## 2018-09-08 DIAGNOSIS — H6522 Chronic serous otitis media, left ear: Secondary | ICD-10-CM | POA: Diagnosis not present

## 2018-09-08 MED FILL — predniSONE 10 MG (48) TBPK: 10 | 12 days supply | Qty: 48 | Fill #0

## 2018-09-15 MED FILL — predniSONE 10 MG (48) TBPK: 10 | 12 days supply | Qty: 48 | Fill #0

## 2018-09-28 MED FILL — XHANCE 93 MCG/ACT EXHU: 93 | 30 days supply | Qty: 16 | Fill #1

## 2018-09-29 DIAGNOSIS — J343 Hypertrophy of nasal turbinates: Secondary | ICD-10-CM | POA: Diagnosis not present

## 2018-09-29 DIAGNOSIS — H9012 Conductive hearing loss, unilateral, left ear, with unrestricted hearing on the contralateral side: Secondary | ICD-10-CM | POA: Diagnosis not present

## 2018-09-29 DIAGNOSIS — J310 Chronic rhinitis: Secondary | ICD-10-CM | POA: Diagnosis not present

## 2018-09-29 DIAGNOSIS — H6522 Chronic serous otitis media, left ear: Secondary | ICD-10-CM | POA: Diagnosis not present

## 2018-09-29 DIAGNOSIS — H6982 Other specified disorders of Eustachian tube, left ear: Secondary | ICD-10-CM | POA: Diagnosis not present

## 2018-09-29 DIAGNOSIS — J342 Deviated nasal septum: Secondary | ICD-10-CM | POA: Diagnosis not present

## 2018-09-29 MED FILL — predniSONE 10 MG (48) TBPK: 10 | 12 days supply | Qty: 48 | Fill #0

## 2018-10-02 ENCOUNTER — Other Ambulatory Visit: Payer: Self-pay | Admitting: Otolaryngology

## 2018-10-02 DIAGNOSIS — J320 Chronic maxillary sinusitis: Secondary | ICD-10-CM

## 2018-10-05 ENCOUNTER — Other Ambulatory Visit: Payer: Self-pay | Admitting: Otolaryngology

## 2018-10-05 DIAGNOSIS — J320 Chronic maxillary sinusitis: Secondary | ICD-10-CM

## 2018-10-11 ENCOUNTER — Ambulatory Visit (INDEPENDENT_AMBULATORY_CARE_PROVIDER_SITE_OTHER): Payer: Self-pay

## 2018-10-11 ENCOUNTER — Other Ambulatory Visit: Payer: Self-pay

## 2018-10-11 DIAGNOSIS — J320 Chronic maxillary sinusitis: Secondary | ICD-10-CM

## 2018-10-11 DIAGNOSIS — J329 Chronic sinusitis, unspecified: Secondary | ICD-10-CM | POA: Diagnosis not present

## 2018-10-12 DIAGNOSIS — J320 Chronic maxillary sinusitis: Secondary | ICD-10-CM | POA: Diagnosis not present

## 2018-10-12 MED FILL — AMOX-CLAV 875-125 MG TABLET: 875-125 | 14 days supply | Qty: 28 | Fill #0

## 2018-10-19 DIAGNOSIS — J320 Chronic maxillary sinusitis: Secondary | ICD-10-CM | POA: Diagnosis not present

## 2018-10-20 DIAGNOSIS — J328 Other chronic sinusitis: Secondary | ICD-10-CM | POA: Diagnosis not present

## 2018-10-20 DIAGNOSIS — J342 Deviated nasal septum: Secondary | ICD-10-CM | POA: Diagnosis not present

## 2018-10-20 DIAGNOSIS — H6500 Acute serous otitis media, unspecified ear: Secondary | ICD-10-CM | POA: Diagnosis not present

## 2018-10-20 DIAGNOSIS — R51 Headache: Secondary | ICD-10-CM | POA: Diagnosis not present

## 2018-10-25 DIAGNOSIS — J328 Other chronic sinusitis: Secondary | ICD-10-CM | POA: Diagnosis not present

## 2018-10-25 DIAGNOSIS — R51 Headache: Secondary | ICD-10-CM | POA: Diagnosis not present

## 2018-10-26 ENCOUNTER — Other Ambulatory Visit: Payer: Self-pay | Admitting: Otolaryngology

## 2018-10-26 DIAGNOSIS — R519 Headache, unspecified: Secondary | ICD-10-CM

## 2018-10-27 ENCOUNTER — Other Ambulatory Visit: Payer: Self-pay | Admitting: Otolaryngology

## 2018-10-27 ENCOUNTER — Ambulatory Visit (HOSPITAL_COMMUNITY)
Admission: RE | Admit: 2018-10-27 | Discharge: 2018-10-27 | Disposition: A | Discharge status: Home / Self Care | Payer: 59 | Source: Ambulatory Visit | Attending: Otolaryngology | Admitting: Otolaryngology

## 2018-10-27 DIAGNOSIS — Z0189 Encounter for other specified special examinations: Secondary | ICD-10-CM

## 2018-10-27 DIAGNOSIS — Z1389 Encounter for screening for other disorder: Secondary | ICD-10-CM | POA: Diagnosis not present

## 2018-10-27 DIAGNOSIS — Z1812 Retained nonmagnetic metal fragments: Secondary | ICD-10-CM | POA: Diagnosis not present

## 2018-10-27 DIAGNOSIS — S80851A Superficial foreign body, right lower leg, initial encounter: Secondary | ICD-10-CM | POA: Diagnosis not present

## 2018-10-27 DIAGNOSIS — Z1339 Encounter for screening examination for other mental health and behavioral disorders: Secondary | ICD-10-CM | POA: Diagnosis not present

## 2018-10-30 ENCOUNTER — Other Ambulatory Visit: Payer: Self-pay

## 2018-10-30 ENCOUNTER — Ambulatory Visit (INDEPENDENT_AMBULATORY_CARE_PROVIDER_SITE_OTHER): Payer: 59

## 2018-10-30 DIAGNOSIS — R51 Headache: Secondary | ICD-10-CM

## 2018-10-30 DIAGNOSIS — R519 Headache, unspecified: Secondary | ICD-10-CM

## 2018-10-30 MED ORDER — GADOBUTROL 1 MMOL/ML IV SOLN
8.5000 mL | Freq: Once | INTRAVENOUS | Status: AC | PRN
  Administered 2018-10-30: 8.5 mL via INTRAVENOUS

## 2018-11-03 DIAGNOSIS — J328 Other chronic sinusitis: Secondary | ICD-10-CM | POA: Diagnosis not present

## 2018-11-03 DIAGNOSIS — R51 Headache: Secondary | ICD-10-CM | POA: Diagnosis not present

## 2018-11-03 DIAGNOSIS — J301 Allergic rhinitis due to pollen: Secondary | ICD-10-CM | POA: Diagnosis not present

## 2018-11-03 DIAGNOSIS — J342 Deviated nasal septum: Secondary | ICD-10-CM | POA: Diagnosis not present

## 2018-11-03 DIAGNOSIS — H6500 Acute serous otitis media, unspecified ear: Secondary | ICD-10-CM | POA: Diagnosis not present

## 2018-11-06 ENCOUNTER — Other Ambulatory Visit
Admission: RE | Admit: 2018-11-06 | Discharge: 2018-11-06 | Disposition: A | Discharge status: Home / Self Care | Payer: 59 | Source: Ambulatory Visit | Attending: Otolaryngology | Admitting: Otolaryngology

## 2018-11-06 ENCOUNTER — Other Ambulatory Visit: Payer: Self-pay

## 2018-11-06 DIAGNOSIS — Z01812 Encounter for preprocedural laboratory examination: Secondary | ICD-10-CM | POA: Insufficient documentation

## 2018-11-06 DIAGNOSIS — Z20828 Contact with and (suspected) exposure to other viral communicable diseases: Secondary | ICD-10-CM | POA: Insufficient documentation

## 2018-11-06 DIAGNOSIS — J328 Other chronic sinusitis: Secondary | ICD-10-CM | POA: Diagnosis not present

## 2018-11-06 LAB — SARS CORONAVIRUS 2 (TAT 6-24 HRS): SARS Coronavirus 2: NEGATIVE

## 2018-11-07 ENCOUNTER — Encounter: Payer: Self-pay | Admitting: *Deleted

## 2018-11-07 ENCOUNTER — Other Ambulatory Visit: Payer: Self-pay

## 2018-11-08 NOTE — Discharge Instructions (Signed)
[facility] °[facility] °ENDOSCOPIC SINUS SURGERY °[facility] ° °What is Functional Endoscopic Sinus Surgery? ° The Surgery involves making the natural openings of the sinuses larger by removing the bony partitions that separate the sinuses from the nasal cavity.  The natural sinus lining is preserved as much as possible to allow the sinuses to resume normal function after the surgery.  In some patients nasal polyps (excessively swollen lining of the sinuses) may be removed to relieve obstruction of the sinus openings.  The surgery is performed through the nose using lighted scopes, which eliminates the need for incisions on the face.  A septoplasty is a different procedure which is sometimes performed with sinus surgery.  It involves straightening the boy partition that separates the two sides of your nose.  A crooked or deviated septum may need repair if is obstructing the sinuses or nasal airflow.  Turbinate reduction is also often performed during sinus surgery.  The turbinates are bony proturberances from the side walls of the nose which swell and can obstruct the nose in patients with sinus and allergy problems.  Their size can be surgically reduced to help relieve nasal obstruction. ° °What Can Sinus Surgery Do For Me? ° Sinus surgery can reduce the frequency of sinus infections requiring antibiotic treatment.  This can provide improvement in nasal congestion, post-nasal drainage, facial pressure and nasal obstruction.  Surgery will NOT prevent you from ever having an infection again, so it usually only for patients who get infections 4 or more times yearly requiring antibiotics, or for infections that do not clear with antibiotics.  It will not cure nasal allergies, so patients with allergies may still require medication to treat their allergies after surgery. Surgery may improve headaches related to sinusitis, however, some people will continue to  require medication to control sinus headaches related to allergies.  Surgery will do nothing for other forms of headache (migraine, tension or cluster). ° °What Are the Risks of Endoscopic Sinus Surgery? ° Current techniques allow surgery to be performed safely with little risk, however, there are rare complications that patients should be aware of.  Because the sinuses are located around the eyes, there is risk of eye injury, including blindness, though again, this would be quite rare. This is usually a result of bleeding behind the eye during surgery, which puts the vision oat risk, though there are treatments to protect the vision and prevent permanent disrupted by surgery causing a leak of the spinal fluid that surrounds the brain.  More serious complications would include bleeding inside the brain cavity or damage to the brain.  Again, all of these complications are uncommon, and spinal fluid leaks can be safely managed surgically if they occur.  The most common complication of sinus surgery is bleeding from the nose, which may require packing or cauterization of the nose.  Continued sinus have polyps may experience recurrence of the polyps requiring revision surgery.  Alterations of sense of smell or injury to the tear ducts are also rare complications.  ° °What is the Surgery Like, and what is the Recovery? ° The Surgery usually takes a couple of hours to perform, and is usually performed under a general anesthetic (completely asleep).  Patients are usually discharged home after a couple of hours.  Sometimes during surgery it is necessary to pack the nose to control bleeding, and the packing is left in place for 24 - 48 hours, and removed by your surgeon.    If a septoplasty was performed during the procedure, there is often a splint placed which must be removed after 5-7 days.   °Discomfort: Pain is usually mild to moderate, and can be controlled by prescription pain medication or acetaminophen (Tylenol).   Aspirin, Ibuprofen (Advil, Motrin), or Naprosyn (Aleve) should be avoided, as they can cause increased bleeding.  Most patients feel sinus pressure like they have a bad head cold for several days.  Sleeping with your head elevated can help reduce swelling and facial pressure, as can ice packs over the face.  A humidifier may be helpful to keep the mucous and blood from drying in the nose.  ° °Diet: There are no specific diet restrictions, however, you should generally start with clear liquids and a light diet of bland foods because the anesthetic can cause some nausea.  Advance your diet depending on how your stomach feels.  Taking your pain medication with food will often help reduce stomach upset which pain medications can cause. ° °Nasal Saline Irrigation: It is important to remove blood clots and dried mucous from the nose as it is healing.  This is done by having you irrigate the nose at least 3 - 4 times daily with a salt water solution.  We recommend using NeilMed Sinus Rinse (available at the drug store).  Fill the squeeze bottle with the solution, bend over a sink, and insert the tip of the squeeze bottle into the nose ½ of an inch.  Point the tip of the squeeze bottle towards the inside corner of the eye on the same side your irrigating.  Squeeze the bottle and gently irrigate the nose.  If you bend forward as you do this, most of the fluid will flow back out of the nose, instead of down your throat.   The solution should be warm, near body temperature, when you irrigate.   Each time you irrigate, you should use a full squeeze bottle.  ° °Note that if you are instructed to use Nasal Steroid Sprays at any time after your surgery, irrigate with saline BEFORE using the steroid spray, so you do not wash it all out of the nose. °Another product, Nasal Saline Gel (such as AYR Nasal Saline Gel) can be applied in each nostril 3 - 4 times daily to moisture the nose and reduce scabbing or crusting. ° °Bleeding:   Bloody drainage from the nose can be expected for several days, and patients are instructed to irrigate their nose frequently with salt water to help remove mucous and blood clots.  The drainage may be dark red or brown, though some fresh blood may be seen intermittently, especially after irrigation.  Do not blow you nose, as bleeding may occur. If you must sneeze, keep your mouth open to allow air to escape through your mouth. ° °If heavy bleeding occurs: Irrigate the nose with saline to rinse out clots, then spray the nose 3 - 4 times with Afrin Nasal Decongestant Spray.  The spray will constrict the blood vessels to slow bleeding.  Pinch the lower half of your nose shut to apply pressure, and lay down with your head elevated.  Ice packs over the nose may help as well. If bleeding persists despite these measures, you should notify your doctor.  Do not use the Afrin routinely to control nasal congestion after surgery, as it can result in worsening congestion and may affect healing.  ° ° ° °Activity: Return to work varies among patients. Most patients will be   out of work at least 5 - 7 days to recover.  Patient may return to work after they are off of narcotic pain medication, and feeling well enough to perform the functions of their job.  Patients must avoid heavy lifting (over 10 pounds) or strenuous physical for 2 weeks after surgery, so your employer may need to assign you to light duty, or keep you out of work longer if light duty is not possible.  NOTE: you should not drive, operate dangerous machinery, do any mentally demanding tasks or make any important legal or financial decisions while on narcotic pain medication and recovering from the general anesthetic.  °  °Call Your Doctor Immediately if You Have Any of the Following: °1. Bleeding that you cannot control with the above measures °2. Loss of vision, double vision, bulging of the eye or black eyes. °3. Fever over 101 degrees °4. Neck stiffness with  severe headache, fever, nausea and change in mental state. °You are always encourage to call anytime with concerns, however, please call with requests for pain medication refills during office hours. ° °Office Endoscopy: During follow-up visits your doctor will remove any packing or splints that may have been placed and evaluate and clean your sinuses endoscopically.  Topical anesthetic will be used to make this as comfortable as possible, though you may want to take your pain medication prior to the visit.  How often this will need to be done varies from patient to patient.  After complete recovery from the surgery, you may need follow-up endoscopy from time to time, particularly if there is concern of recurrent infection or nasal polyps. ° ° °General Anesthesia, Adult, Care After °This sheet gives you information about how to care for yourself after your procedure. Your health care provider may also give you more specific instructions. If you have problems or questions, contact your health care provider. °What can I expect after the procedure? °After the procedure, the following side effects are common: °· Pain or discomfort at the IV site. °· Nausea. °· Vomiting. °· Sore throat. °· Trouble concentrating. °· Feeling cold or chills. °· Weak or tired. °· Sleepiness and fatigue. °· Soreness and body aches. These side effects can affect parts of the body that were not involved in surgery. °Follow these instructions at home: ° °For at least 24 hours after the procedure: °· Have a responsible adult stay with you. It is important to have someone help care for you until you are awake and alert. °· Rest as needed. °· Do not: °? Participate in activities in which you could fall or become injured. °? Drive. °? Use heavy machinery. °? Drink alcohol. °? Take sleeping pills or medicines that cause drowsiness. °? Make important decisions or sign legal documents. °? Take care of children on your own. °Eating and  drinking °· Follow any instructions from your health care provider about eating or drinking restrictions. °· When you feel hungry, start by eating small amounts of foods that are soft and easy to digest (bland), such as toast. Gradually return to your regular diet. °· Drink enough fluid to keep your urine pale yellow. °· If you vomit, rehydrate by drinking water, juice, or clear broth. °General instructions °· If you have sleep apnea, surgery and certain medicines can increase your risk for breathing problems. Follow instructions from your health care provider about wearing your sleep device: °? Anytime you are sleeping, including during daytime naps. °? While taking prescription pain medicines, sleeping medicines, or medicines   that make you drowsy. °· Return to your normal activities as told by your health care provider. Ask your health care provider what activities are safe for you. °· Take over-the-counter and prescription medicines only as told by your health care provider. °· If you smoke, do not smoke without supervision. °· Keep all follow-up visits as told by your health care provider. This is important. °Contact a health care provider if: °· You have nausea or vomiting that does not get better with medicine. °· You cannot eat or drink without vomiting. °· You have pain that does not get better with medicine. °· You are unable to pass urine. °· You develop a skin rash. °· You have a fever. °· You have redness around your IV site that gets worse. °Get help right away if: °· You have difficulty breathing. °· You have chest pain. °· You have blood in your urine or stool, or you vomit blood. °Summary °· After the procedure, it is common to have a sore throat or nausea. It is also common to feel tired. °· Have a responsible adult stay with you for the first 24 hours after general anesthesia. It is important to have someone help care for you until you are awake and alert. °· When you feel hungry, start by eating  small amounts of foods that are soft and easy to digest (bland), such as toast. Gradually return to your regular diet. °· Drink enough fluid to keep your urine pale yellow. °· Return to your normal activities as told by your health care provider. Ask your health care provider what activities are safe for you. °This information is not intended to replace advice given to you by your health care provider. Make sure you discuss any questions you have with your health care provider. °Document Released: 05/03/2000 Document Revised: 01/28/2017 Document Reviewed: 09/10/2016 °Elsevier Patient Education © 2020 [employer] ° °

## 2018-11-09 ENCOUNTER — Other Ambulatory Visit: Payer: Self-pay

## 2018-11-09 ENCOUNTER — Ambulatory Visit: Payer: 59 | Admitting: Anesthesiology

## 2018-11-09 ENCOUNTER — Ambulatory Visit
Admission: RE | Admit: 2018-11-09 | Discharge: 2018-11-09 | Disposition: A | Discharge status: Home / Self Care | Payer: 59 | Source: Ambulatory Visit | Attending: Otolaryngology | Admitting: Otolaryngology

## 2018-11-09 ENCOUNTER — Encounter
Admission: RE | Disposition: A | Discharge status: Home / Self Care | Payer: Self-pay | Source: Ambulatory Visit | Attending: Otolaryngology

## 2018-11-09 DIAGNOSIS — J324 Chronic pansinusitis: Secondary | ICD-10-CM | POA: Insufficient documentation

## 2018-11-09 DIAGNOSIS — J322 Chronic ethmoidal sinusitis: Secondary | ICD-10-CM | POA: Diagnosis not present

## 2018-11-09 DIAGNOSIS — J320 Chronic maxillary sinusitis: Secondary | ICD-10-CM | POA: Diagnosis not present

## 2018-11-09 DIAGNOSIS — J343 Hypertrophy of nasal turbinates: Secondary | ICD-10-CM | POA: Diagnosis not present

## 2018-11-09 DIAGNOSIS — J328 Other chronic sinusitis: Secondary | ICD-10-CM | POA: Diagnosis not present

## 2018-11-09 DIAGNOSIS — Z87891 Personal history of nicotine dependence: Secondary | ICD-10-CM | POA: Diagnosis not present

## 2018-11-09 DIAGNOSIS — J323 Chronic sphenoidal sinusitis: Secondary | ICD-10-CM | POA: Diagnosis not present

## 2018-11-09 DIAGNOSIS — J342 Deviated nasal septum: Secondary | ICD-10-CM | POA: Diagnosis not present

## 2018-11-09 DIAGNOSIS — M3130 Wegener's granulomatosis without renal involvement: Secondary | ICD-10-CM | POA: Diagnosis not present

## 2018-11-09 DIAGNOSIS — J321 Chronic frontal sinusitis: Secondary | ICD-10-CM | POA: Diagnosis not present

## 2018-11-09 HISTORY — PX: TURBINATE REDUCTION: SHX6157

## 2018-11-09 HISTORY — PX: ETHMOIDECTOMY: SHX5197

## 2018-11-09 HISTORY — PX: FRONTAL SINUS EXPLORATION: SHX6591

## 2018-11-09 HISTORY — PX: SEPTOPLASTY: SHX2393

## 2018-11-09 HISTORY — DX: Headache, unspecified: R51.9

## 2018-11-09 HISTORY — PX: MAXILLARY ANTROSTOMY: SHX2003

## 2018-11-09 HISTORY — PX: IMAGE GUIDED SINUS SURGERY: SHX6570

## 2018-11-09 SURGERY — SEPTOPLASTY, NOSE
Anesthesia: General | Site: Nose | Laterality: Bilateral

## 2018-11-09 MED ORDER — PREDNISONE 10 MG PO TABS: ORAL_TABLET | ORAL | 0 refills | Status: DC

## 2018-11-09 MED ORDER — LIDOCAINE HCL (CARDIAC) PF 100 MG/5ML IV SOSY
PREFILLED_SYRINGE | INTRAVENOUS | Status: DC | PRN
  Administered 2018-11-09: 30 mg via INTRAVENOUS

## 2018-11-09 MED ORDER — PHENYLEPHRINE HCL 0.5 % NA SOLN
NASAL | Status: DC | PRN
  Administered 2018-11-09: 15 mL via TOPICAL

## 2018-11-09 MED ORDER — PROMETHAZINE HCL 25 MG/ML IJ SOLN: 6.2500 mg | INTRAMUSCULAR | Status: DC | PRN

## 2018-11-09 MED ORDER — ACETAMINOPHEN 160 MG/5ML PO SOLN: 325.0000 mg | ORAL | Status: DC | PRN

## 2018-11-09 MED ORDER — FENTANYL CITRATE (PF) 100 MCG/2ML IJ SOLN
INTRAMUSCULAR | Status: DC | PRN
  Administered 2018-11-09: 100 ug via INTRAVENOUS
  Administered 2018-11-09 (×2): 50 ug via INTRAVENOUS

## 2018-11-09 MED ORDER — DEXTROSE 5 % IV SOLN
2000.0000 mg | Freq: Once | INTRAVENOUS | Status: AC
  Administered 2018-11-09: 08:00:00 2000 mg via INTRAVENOUS

## 2018-11-09 MED ORDER — LIDOCAINE-EPINEPHRINE 1 %-1:100000 IJ SOLN
INTRAMUSCULAR | Status: DC | PRN
  Administered 2018-11-09: .5 mL
  Administered 2018-11-09: 1 mL

## 2018-11-09 MED ORDER — HYDROCODONE-ACETAMINOPHEN 5-325 MG PO TABS: 1.0000 | ORAL_TABLET | ORAL | 0 refills | Status: AC | PRN

## 2018-11-09 MED ORDER — DEXAMETHASONE SODIUM PHOSPHATE 4 MG/ML IJ SOLN
INTRAMUSCULAR | Status: DC | PRN
  Administered 2018-11-09: 4 mg via INTRAVENOUS

## 2018-11-09 MED ORDER — SUCCINYLCHOLINE CHLORIDE 20 MG/ML IJ SOLN
INTRAMUSCULAR | Status: DC | PRN
  Administered 2018-11-09: 100 mg via INTRAVENOUS

## 2018-11-09 MED ORDER — GLYCOPYRROLATE 0.2 MG/ML IJ SOLN
INTRAMUSCULAR | Status: DC | PRN
  Administered 2018-11-09: 0.1 mg via INTRAVENOUS

## 2018-11-09 MED ORDER — ONDANSETRON HCL 4 MG/2ML IJ SOLN
INTRAMUSCULAR | Status: DC | PRN
  Administered 2018-11-09: 4 mg via INTRAVENOUS

## 2018-11-09 MED ORDER — OXYCODONE HCL 5 MG PO TABS: 5.0000 mg | ORAL_TABLET | Freq: Once | ORAL | Status: AC | PRN

## 2018-11-09 MED ORDER — LACTATED RINGERS IV SOLN
INTRAVENOUS | Status: DC
  Administered 2018-11-09 (×2): via INTRAVENOUS

## 2018-11-09 MED ORDER — MIDAZOLAM HCL 5 MG/5ML IJ SOLN
INTRAMUSCULAR | Status: DC | PRN
  Administered 2018-11-09: 2 mg via INTRAVENOUS

## 2018-11-09 MED ORDER — OXYMETAZOLINE HCL 0.05 % NA SOLN
2.0000 | Freq: Once | NASAL | Status: AC
  Administered 2018-11-09: 2 via NASAL

## 2018-11-09 MED ORDER — OXYCODONE HCL 5 MG/5ML PO SOLN
5.0000 mg | Freq: Once | ORAL | Status: AC | PRN
  Administered 2018-11-09: 12:00:00 5 mg via ORAL

## 2018-11-09 MED ORDER — ACETAMINOPHEN 325 MG PO TABS: 325.0000 mg | ORAL_TABLET | ORAL | Status: DC | PRN

## 2018-11-09 MED ORDER — PROPOFOL 10 MG/ML IV BOLUS
INTRAVENOUS | Status: DC | PRN
  Administered 2018-11-09: 200 mg via INTRAVENOUS

## 2018-11-09 MED ORDER — FENTANYL CITRATE (PF) 100 MCG/2ML IJ SOLN
25.0000 ug | INTRAMUSCULAR | Status: DC | PRN
  Administered 2018-11-09 (×5): 25 ug via INTRAVENOUS

## 2018-11-09 SURGICAL SUPPLY — 41 items
BATTERY INSTRU NAVIGATION (MISCELLANEOUS) ×6 IMPLANT
CANISTER SUCT 1200ML W/VALVE (MISCELLANEOUS) ×2 IMPLANT
CATH IV 18X1 1/4 SAFELET (CATHETERS) ×2 IMPLANT
COAGULATOR SUCT 8FR VV (MISCELLANEOUS) ×2 IMPLANT
ELECT REM PT RETURN 9FT ADLT (ELECTROSURGICAL) ×2
ELECTRODE REM PT RTRN 9FT ADLT (ELECTROSURGICAL) ×1 IMPLANT
GAUZE 4X4 16PLY RFD (DISPOSABLE) ×1 IMPLANT
GLOVE PI ULTRA LF STRL 7.5 (GLOVE) ×2 IMPLANT
GLOVE PI ULTRA NON LATEX 7.5 (GLOVE) ×2
GOWN STRL REUS W/ TWL LRG LVL3 (GOWN DISPOSABLE) ×1 IMPLANT
GOWN STRL REUS W/TWL LRG LVL3 (GOWN DISPOSABLE) ×1
IMPL PROPEL CONTOUR (Prosthesis and Implant ENT) IMPLANT
IMPLANT PROPEL CONTOUR (Prosthesis and Implant ENT) ×4 IMPLANT
IV CATH 18X1 1/4 SAFELET (CATHETERS) ×1
IV NS 500ML (IV SOLUTION) ×1
IV NS 500ML BAXH (IV SOLUTION) ×1 IMPLANT
KIT TURNOVER KIT A (KITS) ×2 IMPLANT
NDL ANESTHESIA 27G X 3.5 (NEEDLE) ×1 IMPLANT
NDL HYPO 27GX1-1/4 (NEEDLE) ×1 IMPLANT
NEEDLE ANESTHESIA  27G X 3.5 (NEEDLE) ×1
NEEDLE ANESTHESIA 27G X 3.5 (NEEDLE) ×1 IMPLANT
NEEDLE HYPO 27GX1-1/4 (NEEDLE) ×2 IMPLANT
NS IRRIG 500ML POUR BTL (IV SOLUTION) ×2 IMPLANT
PACK ENT CUSTOM (PACKS) ×2 IMPLANT
PACKING NASAL EPIS 4X2.4 XEROG (MISCELLANEOUS) ×6 IMPLANT
PATTIES SURGICAL .5 X3 (DISPOSABLE) ×3 IMPLANT
SHAVER DIEGO BLD STD TYPE A (BLADE) ×1 IMPLANT
SOL ANTI-FOG 6CC FOG-OUT (MISCELLANEOUS) ×1 IMPLANT
SOL FOG-OUT ANTI-FOG 6CC (MISCELLANEOUS) ×1
SPLINT NASAL SEPTAL BLV .50 ST (MISCELLANEOUS) ×2 IMPLANT
STRAP BODY AND KNEE 60X3 (MISCELLANEOUS) ×2 IMPLANT
SUT CHROMIC 3-0 (SUTURE) ×1
SUT CHROMIC 3-0 KS 27XMFL CR (SUTURE) ×1
SUT ETHILON 3-0 KS 30 BLK (SUTURE) ×2 IMPLANT
SUT PLAIN GUT 4-0 (SUTURE) ×2 IMPLANT
SUTURE CHRMC 3-0 KS 27XMFL CR (SUTURE) IMPLANT
SYR 3ML LL SCALE MARK (SYRINGE) ×2 IMPLANT
TOWEL OR 17X26 4PK STRL BLUE (TOWEL DISPOSABLE) ×2 IMPLANT
TRACKER CRANIALMASK (MASK) ×2 IMPLANT
TUBING DECLOG MULTIDEBRIDER (TUBING) ×2 IMPLANT
WATER STERILE IRR 250ML POUR (IV SOLUTION) ×2 IMPLANT

## 2018-11-09 NOTE — Anesthesia Postprocedure Evaluation (Signed)
Anesthesia Post Note  Patient: Thomas Gibbs  Procedure(s) Performed: SEPTOPLASTY (Bilateral Nose) TURBINATE REDUCTION-inferior (Bilateral Nose) IMAGE GUIDED SINUS SURGERY (Bilateral Nose) MAXILLARY ANTROSTOMY with removal of tissue (Bilateral Nose) FRONTAL SINUSotomy (Bilateral Nose) Total ETHMOIDECTOMY with sphenoidotomy-Left Total Ethmoidectomy with Sphenoidectomy (Tissue Removal)-Right (Bilateral Nose)  Patient location during evaluation: PACU Anesthesia Type: General Level of consciousness: awake Pain management: pain level controlled Vital Signs Assessment: post-procedure vital signs reviewed and stable Respiratory status: spontaneous breathing Anesthetic complications: no    Wanda Plump Trelon Plush

## 2018-11-09 NOTE — Op Note (Signed)
11/09/2018  11:20 AM  JP:[id number]   Pre-Op Dx:  Deviated Nasal Septum, Hypertrophic Inferior Turbinates, chronic bilateral pansinusitis  Post-op Dx: Same  Proc: Nasal Septoplasty, bilateral endoscopic total ethmoidectomy with frontal sinusotomy, bilateral endoscopic sphenoidotomy with removal of contents, bilateral endoscopic maxillary antrostomies with removal of contents, bilateral Partial Reduction Inferior Turbinates, use of image guided system  Surg:  Thomas Gibbs Thomas Gibbs  Anes:  GOT  EBL: 300 mL  Comp: None  Findings: Extremely inflamed mucous membranes throughout.  His septum was markedly deviated to the right side and it had significant hematoma at the time of surgery with some loss of cartilage support.  His maxillary sinuses had polypoid changes in the mucous membranes of both sides and the right side was completely filled with glue-like white mucus.  His right sphenoid sinus was also completely filled with glue-like white mucus.  The mucous membranes were inflamed granular along the middle turbinates on both sides and the inferior ethmoid air cells.  Procedure: With the patient in a comfortable supine position,  general orotracheal anesthesia was induced without difficulty.     The patient received preoperative Afrin spray for topical decongestion and vasoconstriction.  Intravenous prophylactic antibiotics were administered.  At an appropriate level, the patient was placed in a semi-sitting position.  Nasal vibrissae were trimmed.   1% Xylocaine with 1:100,000 epinephrine, 8 cc's, was infiltrated into the anterior floor of the nose, into the nasal spine region, into the membranous columella, and finally into the submucoperichondrial plane of the septum on both sides.  Several minutes were allowed for this to take effect.  Cottoniod pledgetts soaked in Afrin and 4% Xylocaine were placed into both nasal cavities and left while the patient was prepped and draped in the standard  fashion.  The image guided system was brought in the CT scan was downloaded from the disc.  The template was then applied to the face in a normal fashion and registered to the disc.  There is 0.5 mm of variance.  The suction instruments were then registered to the system and showed good alignment with the CT scan.  The materials were removed from the nose and observed to be intact and correct in number.  The nose was inspected with a headlight and zero degree scope with the findings as described above.  A left hemitransfixion incision was sharply executed and carried down to the quadrangular cartilage. The mucoperichondrium was elelvated along the quadrangular plate back to the bony-cartilaginous junction on both sides. The mucoperiostium was then elevated along the ethmoid plate and the vomer. The boney-catilaginous junction was then split with a freer elevator. The mucoperiosteum was then elevated along the maxillary crest as needed to expose the crooked bone of the crest.  Boney spurs of the vomer and maxillary crest were removed with Donavan Foil forceps.  The cartilaginous plate was trimmed along its posterior and inferior borders of about 2 mm of cartilage to free it up inferiorly. Some of the deviated ethmoid plate was then fractured and removed with Takahashi forceps to free up the posterior border of the quadrangular plate and allow it to swing back to the midline. The mucosal flaps were placed back into their anatomic position to allow visualization of the airways. The septum now sat in the midline with an improved airway.  The cartilaginous plate was fairly thin and mobile and sat in the midline.  There is plenty for support of the nasal dorsum.  A 3-0 Chromic suture on a Keith needle in  used to anchor the inferior septum at the nasal spine with a through and through suture. The mucosal flaps are then sutured together using a through and through whip stitch of 4-0 Plain Gut with a mini-Keith  needle. This was used to close the hemitransfixion incision as well.  The 0 degrees scope was used to visualize the left nasal passage.  The middle turbinate was infractured to allow better visualization of middle meatus.  Expectorants here were very inflamed and granular.  The uncinate process was incised with side biter and then was completely removed with through biting forceps.  The microdebrider was used to widen the maxillary antrum to make sure the natural ostium was included.  It was widened inferiorly and posteriorly.  There was thickened mucus in the sinus with polypoid changes that were all removed.  A wide natural maxillary antrostomy was left for good visualization and cleaning of the sinus.  There was a lot of inflammation and oozing and cottonoid pledgets were placed here while the right side was addressed.  0 degrees scope was used for visualizing the right airway and the middle turbinate was thinner but had a lot of granular tissue on its surface that was trimmed.  It was infractured.  The middle meatus had lots of inflammatory tissue here.  The side biter was used to open up the maxillary antrum and immediately a thick creamy white glue-like fluid started to ooze out.  A culture was taken of this and much of it was suctioned out.  The maxillary antrum was then widened and the uncinate process was completely removed.  The antrum was widened posteriorly and inferiorly to create a large opening.  There were polypoid changes in the sinus that were cleaned out as well along with all of thick mucus.  The maxillary sinus was visualized with 30 and 70 degrees scopes to make sure was clear to the bottom as I had done on the opposite side.  There is again significant bruising here or so cottonoid pledgets were placed here and the left side was revisualize.  Pledgets were removed and the 0 and 30 degrees scopes were used for opening up the ethmoid bulla and into the middle and posterior ethmoid air  cells.  The image guided system was very valuable follow along make sure they found the roof of the ethmoid sinus and followed it anteriorly and posteriorly.  Once the posterior ethmoid air cells were opened then the party wall between the posterior ethmoid and sphenoid sinus was removed to open up the sphenoid sinus.  There is some thickened mucus in the sinus as well.  This completed opening the sinuses except for the anterior ethmoid or frontal.  Cottonoid pledgets were placed here again for vasoconstriction.  The pledgets were removed and the right side and the 0 and 30 degrees scopes were used for opening up the ethmoid bulla as well as the middle and posterior ethmoid air cells.  Again the party wall between the posterior ethmoid and sphenoid sinus was opened widely using sphenoid punches to make sure the sinuses were cleared.  There was very thick glue-like mucus completely filling the sinus that was cleaned out.  The image guided system was used to followed the extent of dissection to make sure all the air cells were opened the posterior middle ethmoid air cells and the sphenoid was widely open.  Cottonoid pledgets were placed around the side temporarily for vasoconstriction.  The left side was then revisualized using the 30  and 70 degrees scopes now the anterior ethmoid air cells were opened.  The frontal sinus duct was found medially and was widened using the frontal sinus Kerrison forceps and the boss frontal sinus through biting instruments.  The frontal sinus was open to about 5 mm.  The anterior ethmoid was completely cleared out.  A propel frontal sinus stent was then placed into the frontal sinus opening to make sure that it was clear.  This was placed under direct vision and was sitting in a good position half in the sinus and half out.  A cotton pledget then was placed on the left side is all the sinuses were opened.  The right side was revisualize and again using the 30 and 70 degrees scopes  the anterior ethmoid air cells were cleaned out and the frontal sinus duct was found.  The frontal sinus duct was widened using the frontal sinus instruments again to about a 5 mm opening.  This was more lateral.  A propel frontal sinus stent was then placed into the frontal sinus under direct vision again with the 70 degrees scope.  This was sitting in good position half in the sinus and half out help hold the sinus open.  Cottonoid pledget was placed here temporarily.  The left side was then re-visualized and there was no significant bleeding anywhere.  All the sinuses were opened and cleaned of any blood clots.  Xerogel was placed into the sphenoid sinus opening and posterior ethmoids.  Some xerogel was then placed into the middle and anterior ethmoids near the frontal sinus duct.  More xerogel was placed to help hold the middle turbinate medialized.  It is covered over some of the opening to the maxillary antrum as well.  The cotton pledget removed on the right side and everything was revisualize and clots suctioned out.  The sinuses were all clear and open.  Xerogel was placed on this side in the same fashion as the other.  The inferior turbinates were then inspected. An incision was created along the inferior aspect of the left inferior turbinate with removal of some of the inferior soft tissue and bone. Electrocautery was used to control bleeding in the area. The remaining turbinate was then outfractured to open the airway further.  There was no significant bleeding noted. The right turbinate was then trimmed and outfractured in a similar fashion.  The airways were then visualized and showed open passageways on both sides that were significantly improved compared to before surgery. There was no signifcant bleeding. Nasal splints were applied to both sides of the septum using Xomed 0.49mm regular sized splints that were trimmed, and then held in position with a 3-0 Nylon through and through suture.  The  patient was turned back over to anesthesia, and awakened, extubated, and taken to the PACU in satisfactory condition.  Dispo:   PACU to home  Plan: Ice, elevation, narcotic analgesia, steroid taper, and prophylactic antibiotics for the duration of indwelling nasal foreign bodies.  We will reevaluate the patient in the office in 6 days and remove the septal splints.  Return to work in 10 days, strenuous activities in two weeks.   Thomas Gibbs Thomas Gibbs 11/09/2018 11:20 AM

## 2018-11-09 NOTE — H&P (Signed)
H&P has been reviewed and patient reevaluated, no changes necessary. To be downloaded later.  

## 2018-11-09 NOTE — Anesthesia Procedure Notes (Signed)
Procedure Name: Intubation Date/Time: 11/09/2018 7:41 AM Performed by: Cameron Ali, CRNA Pre-anesthesia Checklist: Patient identified, Emergency Drugs available, Suction available, Patient being monitored and Timeout performed Patient Re-evaluated:Patient Re-evaluated prior to induction Oxygen Delivery Method: Circle system utilized Preoxygenation: Pre-oxygenation with 100% oxygen Induction Type: IV induction Ventilation: Mask ventilation without difficulty Laryngoscope Size: Mac and 4 Grade View: Grade I Tube type: Oral Rae Tube size: 7.5 mm Number of attempts: 1 Placement Confirmation: ETT inserted through vocal cords under direct vision,  positive ETCO2 and breath sounds checked- equal and bilateral Tube secured with: Tape Dental Injury: Teeth and Oropharynx as per pre-operative assessment

## 2018-11-09 NOTE — Anesthesia Preprocedure Evaluation (Signed)
Anesthesia Evaluation    Airway Mallampati: II  TM Distance: >3 FB Neck ROM: Full    Dental   Pulmonary neg pulmonary ROS, former smoker,    Pulmonary exam normal        Cardiovascular negative cardio ROS Normal cardiovascular exam     Neuro/Psych  Headaches,    GI/Hepatic   Endo/Other    Renal/GU      Musculoskeletal   Abdominal   Peds  Hematology   Anesthesia Other Findings   Reproductive/Obstetrics                             Anesthesia Physical Anesthesia Plan  ASA: II  Anesthesia Plan: General   Post-op Pain Management:    Induction: Intravenous  PONV Risk Score and Plan:   Airway Management Planned: Oral ETT  Additional Equipment:   Intra-op Plan:   Post-operative Plan:   Informed Consent:   Plan Discussed with:   Anesthesia Plan Comments:         Anesthesia Quick Evaluation

## 2018-11-09 NOTE — Transfer of Care (Signed)
Immediate Anesthesia Transfer of Care Note  Patient: Thomas Gibbs  Procedure(s) Performed: SEPTOPLASTY (Bilateral Nose) TURBINATE REDUCTION-inferior (Bilateral Nose) IMAGE GUIDED SINUS SURGERY (Bilateral Nose) MAXILLARY ANTROSTOMY with removal of tissue (Bilateral Nose) FRONTAL SINUSotomy (Bilateral Nose) Total ETHMOIDECTOMY with sphenoidotomy-Left Total Ethmoidectomy with Sphenoidectomy (Tissue Removal)-Right (Bilateral Nose)  Patient Location: PACU  Anesthesia Type: General  Level of Consciousness: awake, alert  and patient cooperative  Airway and Oxygen Therapy: Patient Spontanous Breathing and Patient connected to supplemental oxygen  Post-op Assessment: Post-op Vital signs reviewed, Patient's Cardiovascular Status Stable, Respiratory Function Stable, Patent Airway and No signs of Nausea or vomiting  Post-op Vital Signs: Reviewed and stable  Complications: No apparent anesthesia complications

## 2018-11-10 ENCOUNTER — Encounter: Payer: Self-pay | Admitting: Otolaryngology

## 2018-11-14 DIAGNOSIS — Z48813 Encounter for surgical aftercare following surgery on the respiratory system: Secondary | ICD-10-CM | POA: Diagnosis not present

## 2018-11-14 LAB — AEROBIC/ANAEROBIC CULTURE W GRAM STAIN (SURGICAL/DEEP WOUND)

## 2018-11-14 LAB — SURGICAL PATHOLOGY

## 2018-11-21 DIAGNOSIS — Z23 Encounter for immunization: Secondary | ICD-10-CM | POA: Diagnosis not present

## 2018-11-21 DIAGNOSIS — M3130 Wegener's granulomatosis without renal involvement: Secondary | ICD-10-CM | POA: Diagnosis not present

## 2018-11-21 DIAGNOSIS — R05 Cough: Secondary | ICD-10-CM | POA: Diagnosis not present

## 2018-11-21 DIAGNOSIS — I776 Arteritis, unspecified: Secondary | ICD-10-CM | POA: Diagnosis not present

## 2018-11-21 DIAGNOSIS — Z48813 Encounter for surgical aftercare following surgery on the respiratory system: Secondary | ICD-10-CM | POA: Diagnosis not present

## 2018-11-21 DIAGNOSIS — J329 Chronic sinusitis, unspecified: Secondary | ICD-10-CM | POA: Diagnosis not present

## 2018-11-21 MED FILL — XHANCE 93 MCG/ACT EXHU: 93 | 30 days supply | Qty: 16 | Fill #0

## 2018-11-30 ENCOUNTER — Ambulatory Visit: Payer: 59

## 2018-11-30 ENCOUNTER — Other Ambulatory Visit: Payer: Self-pay | Admitting: Rheumatology

## 2018-11-30 ENCOUNTER — Other Ambulatory Visit: Payer: Self-pay

## 2018-11-30 DIAGNOSIS — R05 Cough: Secondary | ICD-10-CM

## 2018-11-30 DIAGNOSIS — R899 Unspecified abnormal finding in specimens from other organs, systems and tissues: Secondary | ICD-10-CM

## 2018-11-30 DIAGNOSIS — R053 Chronic cough: Secondary | ICD-10-CM

## 2018-11-30 DIAGNOSIS — M3130 Wegener's granulomatosis without renal involvement: Secondary | ICD-10-CM

## 2018-11-30 DIAGNOSIS — R7611 Nonspecific reaction to tuberculin skin test without active tuberculosis: Secondary | ICD-10-CM | POA: Diagnosis not present

## 2018-11-30 DIAGNOSIS — J329 Chronic sinusitis, unspecified: Secondary | ICD-10-CM

## 2018-12-01 DIAGNOSIS — Z48813 Encounter for surgical aftercare following surgery on the respiratory system: Secondary | ICD-10-CM | POA: Diagnosis not present

## 2018-12-04 DIAGNOSIS — R430 Anosmia: Secondary | ICD-10-CM | POA: Diagnosis not present

## 2018-12-15 DIAGNOSIS — Z48813 Encounter for surgical aftercare following surgery on the respiratory system: Secondary | ICD-10-CM | POA: Diagnosis not present

## 2018-12-19 ENCOUNTER — Encounter
Admission: RE | Admit: 2018-12-19 | Discharge: 2018-12-19 | Disposition: A | Discharge status: Home / Self Care | Payer: 59 | Source: Ambulatory Visit | Attending: Pulmonary Disease | Admitting: Pulmonary Disease

## 2018-12-19 ENCOUNTER — Other Ambulatory Visit: Payer: Self-pay

## 2018-12-19 DIAGNOSIS — Z20828 Contact with and (suspected) exposure to other viral communicable diseases: Secondary | ICD-10-CM | POA: Insufficient documentation

## 2018-12-19 DIAGNOSIS — Z01818 Encounter for other preprocedural examination: Secondary | ICD-10-CM | POA: Diagnosis not present

## 2018-12-19 DIAGNOSIS — H6980 Other specified disorders of Eustachian tube, unspecified ear: Secondary | ICD-10-CM | POA: Diagnosis not present

## 2018-12-19 DIAGNOSIS — H6522 Chronic serous otitis media, left ear: Secondary | ICD-10-CM | POA: Diagnosis not present

## 2018-12-19 HISTORY — DX: Wegener's granulomatosis without renal involvement: M31.30

## 2018-12-19 HISTORY — DX: Cardiac arrhythmia, unspecified: I49.9

## 2018-12-19 LAB — CBC
HCT: 39.5 % (ref 39.0–52.0)
Hemoglobin: 12.4 g/dL — ABNORMAL LOW (ref 13.0–17.0)
MCH: 29.1 pg (ref 26.0–34.0)
MCHC: 31.4 g/dL (ref 30.0–36.0)
MCV: 92.7 fL (ref 80.0–100.0)
Platelets: 400 10*3/uL (ref 150–400)
RBC: 4.26 MIL/uL (ref 4.22–5.81)
RDW: 14.8 % (ref 11.5–15.5)
WBC: 10.2 10*3/uL (ref 4.0–10.5)
nRBC: 0 % (ref 0.0–0.2)

## 2018-12-19 LAB — SARS CORONAVIRUS 2 (TAT 6-24 HRS): SARS Coronavirus 2: NEGATIVE

## 2018-12-19 LAB — PROTIME-INR
INR: 1 (ref 0.8–1.2)
Prothrombin Time: 12.6 seconds (ref 11.4–15.2)

## 2018-12-19 LAB — APTT: aPTT: 32 seconds (ref 24–36)

## 2018-12-19 NOTE — Patient Instructions (Signed)
Your procedure is scheduled on: Friday 11/13 Report to Day Surgery. To find out your arrival time please call [phone] between 1PM - 3PM on Thurs 11/12.  Remember: Instructions that are not followed completely may result in serious medical risk,  up to and including death, or upon the discretion of your surgeon and anesthesiologist your  surgery may need to be rescheduled.     _X__ 1. Do not eat food after midnight the night before your procedure.                 No gum chewing or hard candies. You may drink clear liquids up to 2 hours                 before you are scheduled to arrive for your surgery- DO not drink clear                 liquids within 2 hours of the start of your surgery.                 Clear Liquids include:  water, apple juice without pulp, clear carbohydrate                 drink such as Clearfast of Gatorade, Black Coffee or Tea (Do not add                 anything to coffee or tea).  __X__2.  On the morning of surgery brush your teeth with toothpaste and water, you                may rinse your mouth with mouthwash if you wish.  Do not swallow any toothpaste of mouthwash.     _X__ 3.  No Alcohol for 24 hours before or after surgery.   ___ 4.  Do Not Smoke or use e-cigarettes For 24 Hours Prior to Your Surgery.                 Do not use any chewable tobacco products for at least 6 hours prior to                 surgery.  ____  5.  Bring all medications with you on the day of surgery if instructed.   __x__  6.  Notify your doctor if there is any change in your medical condition      (cold, fever, infections).     Do not wear jewelry, make-up, hairpins, clips or nail polish. Do not wear lotions, powders, or perfumes. You may wear deodorant. Do not shave 48 hours prior to surgery. Men may shave face and neck. Do not bring valuables to the hospital.    [facility] is not responsible for any belongings or valuables.  Contacts,  dentures or bridgework may not be worn into surgery. Leave your suitcase in the car. After surgery it may be brought to your room. For patients admitted to the hospital, discharge time is determined by your treatment team.   Patients discharged the day of surgery will not be allowed to drive home.   Please read over the following fact sheets that you were given:    _x___ Take these medicines the morning of surgery with A SIP OF WATER:    1. Fluticasone Propionate (XHANCE) 93 MCG/ACT EXHU  2. predniSONE (DELTASONE) 20 MG tablet  3. acetaminophen (TYLENOL) 325 MG tablet  4.  5.  6.  ____ Fleet Enema (as directed)   ____  Use CHG Soap as directed  ____ Use inhalers on the day of surgery  ____ Stop metformin 2 days prior to surgery    ____ Take 1/2 of usual insulin dose the night before surgery. No insulin the morning          of surgery.   ____ Stop Coumadin/Plavix/aspirin   __x__ Stop Anti-inflammatories ibuprofen, aleve or aspirin until after the surgery   ____ Stop supplements until after surgery.    ____ Bring C-Pap to the hospital.

## 2018-12-22 ENCOUNTER — Ambulatory Visit: Payer: 59 | Admitting: Certified Registered Nurse Anesthetist

## 2018-12-22 ENCOUNTER — Other Ambulatory Visit: Payer: Self-pay

## 2018-12-22 ENCOUNTER — Ambulatory Visit
Admission: RE | Admit: 2018-12-22 | Discharge: 2018-12-22 | Disposition: A | Discharge status: Home / Self Care | Payer: 59 | Source: Ambulatory Visit | Attending: Pulmonary Disease | Admitting: Pulmonary Disease

## 2018-12-22 ENCOUNTER — Ambulatory Visit: Payer: 59

## 2018-12-22 ENCOUNTER — Encounter
Admission: RE | Disposition: A | Discharge status: Home / Self Care | Payer: Self-pay | Source: Ambulatory Visit | Attending: Pulmonary Disease

## 2018-12-22 ENCOUNTER — Encounter: Payer: Self-pay | Admitting: *Deleted

## 2018-12-22 DIAGNOSIS — M3130 Wegener's granulomatosis without renal involvement: Secondary | ICD-10-CM | POA: Insufficient documentation

## 2018-12-22 DIAGNOSIS — Z87891 Personal history of nicotine dependence: Secondary | ICD-10-CM | POA: Diagnosis not present

## 2018-12-22 DIAGNOSIS — J984 Other disorders of lung: Secondary | ICD-10-CM | POA: Insufficient documentation

## 2018-12-22 DIAGNOSIS — Z832 Family history of diseases of the blood and blood-forming organs and certain disorders involving the immune mechanism: Secondary | ICD-10-CM | POA: Insufficient documentation

## 2018-12-22 DIAGNOSIS — R918 Other nonspecific abnormal finding of lung field: Secondary | ICD-10-CM | POA: Diagnosis not present

## 2018-12-22 HISTORY — PX: VIDEO BRONCHOSCOPY WITH ENDOBRONCHIAL NAVIGATION: SHX6175

## 2018-12-22 SURGERY — VIDEO BRONCHOSCOPY WITH ENDOBRONCHIAL NAVIGATION
Anesthesia: General

## 2018-12-22 MED ORDER — PROPOFOL 10 MG/ML IV BOLUS
INTRAVENOUS | Status: AC
  Filled 2018-12-22: qty 20

## 2018-12-22 MED ORDER — DEXMEDETOMIDINE HCL 200 MCG/2ML IV SOLN
INTRAVENOUS | Status: DC | PRN
  Administered 2018-12-22 (×3): 8 ug via INTRAVENOUS
  Administered 2018-12-22 (×2): 4 ug via INTRAVENOUS

## 2018-12-22 MED ORDER — PROPOFOL 10 MG/ML IV BOLUS
INTRAVENOUS | Status: DC | PRN
  Administered 2018-12-22: 200 mg via INTRAVENOUS

## 2018-12-22 MED ORDER — ONDANSETRON HCL 4 MG/2ML IJ SOLN
INTRAMUSCULAR | Status: AC
  Filled 2018-12-22: qty 2

## 2018-12-22 MED ORDER — FAMOTIDINE 20 MG PO TABS
ORAL_TABLET | ORAL | Status: AC
  Administered 2018-12-22: 20 mg via ORAL
  Filled 2018-12-22: qty 1

## 2018-12-22 MED ORDER — DEXAMETHASONE SODIUM PHOSPHATE 10 MG/ML IJ SOLN
INTRAMUSCULAR | Status: DC | PRN
  Administered 2018-12-22: 10 mg via INTRAVENOUS

## 2018-12-22 MED ORDER — SUCCINYLCHOLINE CHLORIDE 20 MG/ML IJ SOLN
INTRAMUSCULAR | Status: DC | PRN
  Administered 2018-12-22: 100 mg via INTRAVENOUS

## 2018-12-22 MED ORDER — DEXMEDETOMIDINE HCL IN NACL 200 MCG/50ML IV SOLN
INTRAVENOUS | Status: AC
  Filled 2018-12-22: qty 50

## 2018-12-22 MED ORDER — LIDOCAINE HCL (CARDIAC) PF 100 MG/5ML IV SOSY
PREFILLED_SYRINGE | INTRAVENOUS | Status: DC | PRN
  Administered 2018-12-22: 100 mg via INTRAVENOUS

## 2018-12-22 MED ORDER — LIDOCAINE HCL URETHRAL/MUCOSAL 2 % EX GEL
CUTANEOUS | Status: AC
  Filled 2018-12-22: qty 5

## 2018-12-22 MED ORDER — FENTANYL CITRATE (PF) 100 MCG/2ML IJ SOLN
INTRAMUSCULAR | Status: AC
  Filled 2018-12-22: qty 2

## 2018-12-22 MED ORDER — SUCCINYLCHOLINE CHLORIDE 20 MG/ML IJ SOLN
INTRAMUSCULAR | Status: AC
  Filled 2018-12-22: qty 1

## 2018-12-22 MED ORDER — SUGAMMADEX SODIUM 200 MG/2ML IV SOLN: INTRAVENOUS | Status: DC | PRN

## 2018-12-22 MED ORDER — FENTANYL CITRATE (PF) 100 MCG/2ML IJ SOLN: 25.0000 ug | INTRAMUSCULAR | Status: DC | PRN

## 2018-12-22 MED ORDER — LACTATED RINGERS IV SOLN
INTRAVENOUS | Status: DC
  Administered 2018-12-22: 13:00:00 via INTRAVENOUS

## 2018-12-22 MED ORDER — SEVOFLURANE IN SOLN
RESPIRATORY_TRACT | Status: AC
  Filled 2018-12-22: qty 250

## 2018-12-22 MED ORDER — ROCURONIUM BROMIDE 100 MG/10ML IV SOLN
INTRAVENOUS | Status: DC | PRN
  Administered 2018-12-22 (×2): 20 mg via INTRAVENOUS

## 2018-12-22 MED ORDER — PROMETHAZINE HCL 25 MG/ML IJ SOLN: 6.2500 mg | INTRAMUSCULAR | Status: DC | PRN

## 2018-12-22 MED ORDER — FAMOTIDINE 20 MG PO TABS
20.0000 mg | ORAL_TABLET | Freq: Once | ORAL | Status: AC
  Administered 2018-12-22: 12:00:00 20 mg via ORAL

## 2018-12-22 MED ORDER — ONDANSETRON HCL 4 MG/2ML IJ SOLN
INTRAMUSCULAR | Status: DC | PRN
  Administered 2018-12-22: 4 mg via INTRAVENOUS

## 2018-12-22 MED ORDER — ROCURONIUM BROMIDE 50 MG/5ML IV SOLN
INTRAVENOUS | Status: AC
  Filled 2018-12-22: qty 1

## 2018-12-22 MED ORDER — MIDAZOLAM HCL 2 MG/2ML IJ SOLN
INTRAMUSCULAR | Status: DC | PRN
  Administered 2018-12-22: 2 mg via INTRAVENOUS

## 2018-12-22 MED ORDER — PHENYLEPHRINE HCL (PRESSORS) 10 MG/ML IV SOLN
INTRAVENOUS | Status: AC
  Filled 2018-12-22: qty 1

## 2018-12-22 MED ORDER — MIDAZOLAM HCL 2 MG/2ML IJ SOLN
INTRAMUSCULAR | Status: AC
  Filled 2018-12-22: qty 2

## 2018-12-22 MED ORDER — FENTANYL CITRATE (PF) 100 MCG/2ML IJ SOLN
INTRAMUSCULAR | Status: DC | PRN
  Administered 2018-12-22: 100 ug via INTRAVENOUS

## 2018-12-22 MED ORDER — DEXAMETHASONE SODIUM PHOSPHATE 10 MG/ML IJ SOLN
INTRAMUSCULAR | Status: AC
  Filled 2018-12-22: qty 1

## 2018-12-22 MED ORDER — SUGAMMADEX SODIUM 200 MG/2ML IV SOLN
INTRAVENOUS | Status: DC | PRN
  Administered 2018-12-22: 200 mg via INTRAVENOUS

## 2018-12-22 NOTE — Anesthesia Post-op Follow-up Note (Signed)
Anesthesia QCDR form completed.        

## 2018-12-22 NOTE — Transfer of Care (Signed)
Immediate Anesthesia Transfer of Care Note  Patient: Thomas Gibbs  Procedure(s) Performed: VIDEO BRONCHOSCOPY WITH ENDOBRONCHIAL NAVIGATION (N/A )  Patient Location: PACU  Anesthesia Type:General  Level of Consciousness: awake and patient cooperative  Airway & Oxygen Therapy: Patient Spontanous Breathing and Patient connected to face mask oxygen  Post-op Assessment: Report given to RN and Post -op Vital signs reviewed and stable  Post vital signs: Reviewed and stable  Last Vitals:  Vitals Value Taken Time  BP 111/76 12/22/18 1424  Temp    Pulse 80 12/22/18 1425  Resp 19 12/22/18 1425  SpO2 100 % 12/22/18 1425  Vitals shown include unvalidated device data.  Last Pain:  Vitals:   12/22/18 1222  TempSrc: Oral  PainSc: 0-No pain         Complications: No apparent anesthesia complications

## 2018-12-22 NOTE — H&P (Signed)
Pulmonary Medicine          Date: 12/22/2018,   MRN# [id number] Thomas Gibbs Nov 28, 1970     AdmissionWeight: 88 kg                 CurrentWeight: 88 kg      CHIEF COMPLAINT:   Right lung mass   HISTORY OF PRESENT ILLNESS   Chief Complaint: Recurent sinusitis and abnormal CT chest.    HPI :  Mr. Thomas Gibbs is 48 y.o. male who was referred to [facility] Pulmonary clinic due to abnormal CT chest.   Patient reports chronic allergies, also some concern for sleep apnea uses CPAP machine.  Previous history of epistaxis was seen by ENT had nasal crusting and congestion which was evacuated status post prednisone taper and Bactrim with recurrence of symptoms after stopping treatment.  He was evaluated by rheumatology with positive PR-3 antibodies and elevation of c-ANCA he does have a family history positive for Wegener's and father who eventually passed from malignant melanoma. Patient states his father had sinus issues as well and required recurrent sinus treatments.  He had CRP done which was elevated 2 weeks ago at 52.  He had an indeterminate QuantiFERON gold result 2 weeks ago.  His sedimentation rate was elevated at 66.  Patient had smoking hx from age 60 to 25 pack per day for total 10 pack years.  Patient denies having sick contacts, no known exposure to TB, no travel hx and has no hx of chronic lung disease.  He has been on prednisone recurrently , one burst in august, then again for 20 days and recently on Oct 13th restarted prednisone 40mg  daily until further notice. Since then his smell / taste improved. Since starting prednisone.   Past medical History: He  has a past medical history of Allergy.   PAST MEDICAL HISTORY   Past Medical History:  Diagnosis Date   Dysrhythmia    tachycardia    Headache    sinus   Wegener's granulomatosis (granulomatosis with polyangiitis) [facility])      SURGICAL HISTORY   Past Surgical History:  Procedure Laterality  Date   ETHMOIDECTOMY Bilateral 11/09/2018   Procedure: Total ETHMOIDECTOMY with sphenoidotomy-Left Total Ethmoidectomy with Sphenoidectomy (Tissue Removal)-Right;  Surgeon: Margaretha Sheffield, MD;  Location: [place];  Service: ENT;  Laterality: Bilateral;   FRONTAL SINUS EXPLORATION Bilateral 11/09/2018   Procedure: FRONTAL SINUSotomy;  Surgeon: Margaretha Sheffield, MD;  Location: [place];  Service: ENT;  Laterality: Bilateral;   IMAGE GUIDED SINUS SURGERY Bilateral 11/09/2018   Procedure: IMAGE GUIDED SINUS SURGERY;  Surgeon: Margaretha Sheffield, MD;  Location: [place];  Service: ENT;  Laterality: Bilateral;  NEED STRYKER DISK   MAXILLARY ANTROSTOMY Bilateral 11/09/2018   Procedure: MAXILLARY ANTROSTOMY with removal of tissue;  Surgeon: Margaretha Sheffield, MD;  Location: [place];  Service: ENT;  Laterality: Bilateral;   SEPTOPLASTY Bilateral 11/09/2018   Procedure: SEPTOPLASTY;  Surgeon: Margaretha Sheffield, MD;  Location: [place];  Service: ENT;  Laterality: Bilateral;  Gave disk to [place] Bilateral 11/09/2018   Procedure: TURBINATE REDUCTION-inferior;  Surgeon: Margaretha Sheffield, MD;  Location: [place];  Service: ENT;  Laterality: Bilateral;   WISDOM TOOTH EXTRACTION       FAMILY HISTORY   History reviewed. No pertinent family history.   SOCIAL HISTORY   Social History   Tobacco Use   Smoking status: Former Smoker    Years:  14.00    Types: Cigarettes    Quit date: 2001    Years since quitting: 19.8   Smokeless tobacco: Former Systems developer    Types: Chew    Quit date: 12/18/2017  Substance Use Topics   Alcohol use: Not Currently    Alcohol/week: 3.0 standard drinks    Types: 3 Cans of beer per week   Drug use: Never     MEDICATIONS    Home Medication:    Current Medication:  Current Facility-Administered Medications:    famotidine (PEPCID) 20 MG tablet, , , ,     ALLERGIES   Patient has no  known allergies.     REVIEW OF SYSTEMS    Review of Systems:  Gen:  Denies  fever, sweats, chills weigh loss  HEENT: Denies blurred vision, double vision, ear pain, eye pain, hearing loss, nose bleeds, sore throat Cardiac:  No dizziness, chest pain or heaviness, chest tightness,edema Resp:   Denies cough or sputum porduction, shortness of breath,wheezing, hemoptysis,  Gi: Denies swallowing difficulty, stomach pain, nausea or vomiting, diarrhea, constipation, bowel incontinence Gu:  Denies bladder incontinence, burning urine Ext:   Denies Joint pain, stiffness or swelling Skin: Denies  skin rash, easy bruising or bleeding or hives Endoc:  Denies polyuria, polydipsia , polyphagia or weight change Psych:   Denies depression, insomnia or hallucinations   Other:  All other systems negative   VS: BP 119/82    Pulse 76    Temp 98.1 F (36.7 C) (Oral)    Resp 16    Ht 5\' 10"  (1.778 m)    Wt 88 kg    SpO2 99%    BMI 27.84 kg/m      PHYSICAL EXAM    GENERAL:NAD, no fevers, chills, no weakness no fatigue HEAD: Normocephalic, atraumatic.  EYES: Pupils equal, round, reactive to light. Extraocular muscles intact. No scleral icterus.  MOUTH: Moist mucosal membrane. Dentition intact. No abscess noted.  EAR, NOSE, THROAT: Clear without exudates. No external lesions.  NECK: Supple. No thyromegaly. No nodules. No JVD.  PULMONARY: Diffuse coarse rhonchi right sided +wheezes CARDIOVASCULAR: S1 and S2. Regular rate and rhythm. No murmurs, rubs, or gallops. No edema. Pedal pulses 2+ bilaterally.  GASTROINTESTINAL: Soft, nontender, nondistended. No masses. Positive bowel sounds. No hepatosplenomegaly.  MUSCULOSKELETAL: No swelling, clubbing, or edema. Range of motion full in all extremities.  NEUROLOGIC: Cranial nerves II through XII are intact. No gross focal neurological deficits. Sensation intact. Reflexes intact.  SKIN: No ulceration, lesions, rashes, or cyanosis. Skin warm and dry. Turgor  intact.  PSYCHIATRIC: Mood, affect within normal limits. The patient is awake, alert and oriented x 3. Insight, judgment intact.       IMAGING    Ct Chest Wo Contrast  Result Date: 11/30/2018 CLINICAL DATA:  Inconclusive tuberculosis test. Patient without symptoms. EXAM: CT CHEST WITHOUT CONTRAST TECHNIQUE: Multidetector CT imaging of the chest was performed following the standard protocol without IV contrast. COMPARISON:  Chest radiograph from [place] dated 11/21/2018. FINDINGS: Cardiovascular: Normal caliber of the aorta and branch vessels. Normal heart size, without pericardial effusion. Mediastinum/Nodes: No supraclavicular adenopathy. No mediastinal adenopathy. Hilar regions poorly evaluated without intravenous contrast. Lungs/Pleura: No pleural fluid. Presumed secretions in the right mainstem bronchus. A subpleural right lower lobe 4 mm nodule on 104/3 is likely a subpleural lymph node. There is soft tissue thickening surrounding the right upper lobe bronchus, including on 67/3, sagittal image 67 and coronal image 80. This measures on the order of 11  mm at its most focal area. Upper Abdomen: Normal imaged portions of the liver, spleen, stomach, pancreas, gallbladder, adrenal glands, kidneys. Musculoskeletal: No acute osseous abnormality. IMPRESSION: 1. Relatively focal wall thickening along the right upper lobe endobronchial tree. Although this could represent an area of active tuberculosis (given the clinical history), neoplastic process such as squamous cell carcinoma cannot be excluded. Consider bronchoscopy with attention to the right upper lobe bronchus. 2. No thoracic adenopathy. These results will be called to the ordering clinician or representative by the Radiologist Assistant, and communication documented in the PACS or zVision Dashboard. Electronically Signed   By: Abigail Miyamoto M.D.   On: 11/30/2018 15:52      ASSESSMENT/PLAN     Right upper lobe lung mass    - radiology  report suspicious for cancer   - possibly related to PGA vs malignancy vs infectious etiology   - will schedule for electromagnetic navigation bronchoscopy   - possible EBUS    - discussed risk and complications of surgery - including death, infection , bleeding, pneumothorax - patient is agreeable  LATERALITY IS RIGHT LUNG FOR EBUS AND EMB          Thank you for allowing me to participate in the care of this patient.   Patient/Family are satisfied with care plan and all questions have been answered.  This document was prepared using Dragon voice recognition software and may include unintentional dictation errors.     Ottie Glazier, M.D.  Division of [place]

## 2018-12-22 NOTE — Discharge Instructions (Signed)

## 2018-12-22 NOTE — Procedures (Signed)
FLEXIBLE BRONCHOSCOPY PROCEDURE NOTE    Flexible bronchoscopy was performed on 12/22/18 by :Lanney Gins MD  assistance by : Maebelle Munroe RT  and 2)Anesthesia team 3) [facility] staff   Indication for the procedure was :  Pre-procedural H&P. The following assessment was performed on the day of the procedure prior to initiating sedation History:  Chest pain none Dyspnea mild Hemoptysis none Cough none Fever none   Examination Vital signs -reviewed as per nursing documentation today Cardiac    Murmurs: None  Rubs : None  Gallop: None Lungs Wheezing: Right upper lung zone Rales : None Rhonchi : None    Pre-procedural assessment for Procedural Sedation included: Depth of sedation: As per anesthesia team  ASA Classification:  2 Mallampati airway assessment: 2    Medication list reviewed: Yes  The patients interval history was taken and revealed: no new complaints The pre- procedure physical examination revealed: No new findings Refer to prior clinic note for details.  Informed Consent: Informed consent was obtained from:  patient after explanation of procedure and risks, benefits, as well as alternative procedures available.  Explanation of level of sedation and possible transfusion was also provided.    Procedural Preparation: Time out was performed and patient was identified by name and birthdate and procedure to be performed and side for sampling, if any, was specified. Pt was intubated by anesthesia.  The patient was appropriately draped.  Procedure Findings: Bronchoscope was inserted via ETT  without difficulty.  Posterior oropharynx, epiglottis, arytenoids, false cords and vocal cords were not visualized as these were bypassed by endotracheal tube.   The distal trachea was normal in circumference and appearance without mucosal, cartilaginous or branching abnormalities.  The main carina was mildly splayed.   All right and left lobar airways were  visualized to the Subsegmental level.  Sub- sub segmental carinae were identified in all the distal airways.   Secretions were visible in the following airways and appeared to be clear.  The mucosa was : Excoriated in focal areas as described below  Airways were notable for:        exophytic lesions : None       extrinsic compression in the following distributions: Right upper lobe.       Friable mucosa: Right upper lobe, right lower lobe superior segment, left lingular segments       Anthrocotic material /pigmentation: None   Pictorial documentation attached: Attached below  Procedure findings: Upon airway inspection there was immediately evident that the right mainstem bronchus leading towards the right upper lobe had abnormal mucosal with excoriation as well as friable areas seen in image #3, this extended towards the right upper lobe trifurcation can be seen in image #4 with significant swelling and edema as well as partial stenosis of apical posterior and anterior segment of the right upper lobe.  This was also the segment where abnormal CT imaging showed findings concerning for possible mass lesion.  Multiple biopsies were taken of this area including transbronchial biopsies, surgical forcep biopsies x6 and Cytobrush specimens.  Additionally this area of the right upper lobe had bronchoalveolar lavage performed which was sent for cytology as well as microbiology evaluation.  Proceeding further down tracheobronchial tree the bronchus intermedius had a similar lesion leading towards the right lower lobe superior segment which can be seen on image #5 this was biopsied multiple times with surgical forceps and can be seen in image #6.  The remainder of the right lung was unremarkable including right middle  lobe and basal segments of the right lower lobe.  Next the left lung airway was evaluated the left upper lobe was normal without friable mucosal or abnormal lesions, the left lower lobe basal  segments also unremarkable, lingular segments were noted to have similar appearing abnormalities of the mucosa including excoriation and edema which can be visualized in image 6 and multiple forcep biopsies were obtained of this lesion seen on image 7.  All forcep surgical biopsies were sent for pathology evaluation in formalin. [facility] staff was able to visualize under microscopy multiple specimens without atypical cells on preliminary reading.  Postprocedure diagnosis: Abnormal mucosa with excoriated lesions suggestive of inflammatory process    Specimens obtained included:                     Cytology brushes : Of left and right lung  Broncho-alveolar lavage site: Right upper lobe sent for microbiology and cytology                             74m volume infused 40 ml volume returned with serosanguineous appearance  Endobronchial biopsy site: Bilateral lungs -at the right upper lobe, right lower lobe superior segment, lingular segments,; sent for cytology                                 Transbronchial biopsy site: Right upper lobe; sent for cytology   Fluoroscopy Used: No;        Pictorial documentation attached: Yes no                  Immediate sampling complications included: None Epinephrine 0 ml was used topically  The bronchoscopy was terminated due to completion of the planned procedure and the bronchoscope was removed.   Total dosage of Lidocaine was 0 mg Total fluoroscopy time was 0 minutes    Estimated Blood loss: 1 cc.  Complications included: None immediate    Disposition: Home with wife-discussed operative findings and provided with report as well as color imaging  Follow up with Dr. Sutter Ahlgren/[facility] in 7 days for result discussion.   FClaudette StaplerMD  KWatertownDivision of Pulmonary & Critical Care Medicine

## 2018-12-22 NOTE — Anesthesia Procedure Notes (Addendum)
Procedure Name: Intubation Date/Time: 12/22/2018 1:30 PM Performed by: Jonna Clark, CRNA Pre-anesthesia Checklist: Patient identified, Patient being monitored, Timeout performed, Emergency Drugs available and Suction available Patient Re-evaluated:Patient Re-evaluated prior to induction Oxygen Delivery Method: Circle system utilized Preoxygenation: Pre-oxygenation with 100% oxygen Induction Type: IV induction Ventilation: Mask ventilation without difficulty Laryngoscope Size: Mac and 4 Grade View: Grade II Tube type: Oral Tube size: 9.0 mm Number of attempts: 2 (SRNA attempted X1) Airway Equipment and Method: Stylet Placement Confirmation: ETT inserted through vocal cords under direct vision,  positive ETCO2 and breath sounds checked- equal and bilateral Secured at: 23 cm Tube secured with: Tape Dental Injury: Teeth and Oropharynx as per pre-operative assessment  Difficulty Due To: Difficult Airway- due to anterior larynx Future Recommendations: Recommend- induction with short-acting agent, and alternative techniques readily available Comments: SRNA attempted intubation w/ MAC 4, grade 2 view.

## 2018-12-22 NOTE — Anesthesia Preprocedure Evaluation (Signed)
Anesthesia Evaluation    History of Anesthesia Complications Negative for: history of anesthetic complications  Airway Mallampati: II  TM Distance: >3 FB Neck ROM: Full    Dental  (+) Dental Advidsory Given, Caps, Teeth Intact, Missing   Pulmonary neg pulmonary ROS, former smoker,    Pulmonary exam normal        Cardiovascular negative cardio ROS Normal cardiovascular exam     Neuro/Psych  Headaches, neg Seizures    GI/Hepatic negative GI ROS, Neg liver ROS,   Endo/Other  negative endocrine ROS  Renal/GU negative Renal ROS     Musculoskeletal   Abdominal   Peds  Hematology   Anesthesia Other Findings Past Medical History: No date: Dysrhythmia     Comment:  tachycardia  No date: Headache     Comment:  sinus No date: Wegener's granulomatosis (granulomatosis with polyangiitis)  (HCC)   Reproductive/Obstetrics                             Anesthesia Physical  Anesthesia Plan  ASA: II  Anesthesia Plan: General   Post-op Pain Management:    Induction: Intravenous  PONV Risk Score and Plan: 2 and Ondansetron, Dexamethasone, Midazolam and Treatment may vary due to age or medical condition  Airway Management Planned: Oral ETT  Additional Equipment:   Intra-op Plan:   Post-operative Plan: Extubation in OR  Informed Consent:     Dental advisory given  Plan Discussed with:   Anesthesia Plan Comments:         Anesthesia Quick Evaluation

## 2018-12-23 LAB — ACID FAST SMEAR (AFB, MYCOBACTERIA): Acid Fast Smear: NEGATIVE

## 2018-12-25 ENCOUNTER — Encounter: Payer: Self-pay | Admitting: Pulmonary Disease

## 2018-12-25 LAB — CYTOLOGY - NON PAP

## 2018-12-25 NOTE — Anesthesia Postprocedure Evaluation (Signed)
Anesthesia Post Note  Patient: Thomas Gibbs  Procedure(s) Performed: VIDEO BRONCHOSCOPY WITH ENDOBRONCHIAL NAVIGATION (N/A )  Anesthesia Type: General     Last Vitals:  Vitals:   12/22/18 1454 12/22/18 1509  BP: 94/65 108/73  Pulse: 67 78  Resp: 18 16  Temp: (!) 36.3 C 36.4 C  SpO2: 96% 98%    Last Pain:  Vitals:   12/25/18 0851  TempSrc:   PainSc: 0-No pain                 Martha Clan

## 2018-12-25 NOTE — Op Note (Signed)
[facility] Patient Name: Thomas Gibbs Procedure Date no Time: 12/22/2018 MRN: [id number] Attending MD: Ottie Glazier MD, MD Date of Birth: 09/07/1970 CSN: [id number] Age: 48 Admit Type: Outpatient Gender: Male Note Status: Finalized Procedure Date: 12/22/2018 1:27 PM Procedure:             Bronchoscopy Indications:           Right upper lobe mass Providers:             Ottie Glazier MD, MD, Frederich Cha RRT, Technician                         (Technician) Referring MD:           Medicines:              Complications:         No immediate complications Estimated Blood Loss:      Estimated blood loss: none. Procedure:             Pre-Anesthesia Assessment:                        - A History and Physical has been performed. Patient                         meds and allergies have been reviewed. The risks and                         benefits of the procedure and the sedation options and                         risks were discussed with the patient. All questions                         were answered and informed consent was obtained.                         Patient identification and proposed procedure were                         verified prior to the procedure. Mental Status                         Examination: normal. CV Examination: normal. ASA Grade                         Assessment: II - A patient with mild systemic disease.                         After reviewing the risks and benefits, the patient                         was deemed in satisfactory condition to undergo the                         procedure. The anesthesia plan was to use general                         anesthesia. Immediately prior to administration of  medications, the patient was re-assessed for adequacy                         to receive sedatives. The heart rate, respiratory                         rate, oxygen saturations, blood pressure, adequacy of              pulmonary ventilation, and response to care were                         monitored throughout the procedure. The physical                         status of the patient was re-assessed after the                         procedure.                        After obtaining informed consent, the bronchoscope was                         passed under direct vision. Throughout the procedure,                         the patient's blood pressure, pulse, and oxygen                         saturations were monitored continuously. the                         Bronchoscope was introduced through the mouth, via the                         endotracheal tube and advanced to the tracheobronchial                         tree of both lungs. Findings:      Right Lung Abnormalities: Areas of chronically inflamed mucosa were       found in the right upper lobe and in the superior segment of the right       lower lobe (B6). BAL was performed in the lung and sent for. Impression:            - Right upper lobe mass                        - Chronic mucosal inflammation was visualized in the                         right upper lobe and in the superior segment of the                         right lower lobe (B6).                        - Bronchoalveolar lavage was performed. Moderate Sedation:      Recommendation:        - Await biopsy, brushing, culture and  cytology results. Procedure Code(s):      Diagnosis Code(s):      CPT copyright 2019 [employer]. All rights reserved. Attending Participation: Ottie Glazier, MD Ottie Glazier MD, MD 12/22/2018 2:22:08 PM This report has been signed electronically. Number of Addenda: 0 Scope In: Scope Out: Note Initiated On: 12/22/2018 1:27 PM

## 2018-12-26 MED FILL — predniSONE 20 MG TABS: 20 | 30 days supply | Qty: 60 | Fill #0

## 2018-12-26 MED FILL — XHANCE 93 MCG/ACT EXHU: 93 | 30 days supply | Qty: 16 | Fill #1

## 2018-12-28 ENCOUNTER — Telehealth: Payer: Self-pay | Admitting: Hematology and Oncology

## 2018-12-28 DIAGNOSIS — Z48813 Encounter for surgical aftercare following surgery on the respiratory system: Secondary | ICD-10-CM | POA: Diagnosis not present

## 2018-12-28 DIAGNOSIS — H90A22 Sensorineural hearing loss, unilateral, left ear, with restricted hearing on the contralateral side: Secondary | ICD-10-CM | POA: Diagnosis not present

## 2018-12-29 LAB — SURGICAL PATHOLOGY

## 2019-01-03 DIAGNOSIS — M3130 Wegener's granulomatosis without renal involvement: Secondary | ICD-10-CM | POA: Insufficient documentation

## 2019-01-03 NOTE — Progress Notes (Signed)
[facility]  [phone] [phone]  ID: Thomas Gibbs OB: January 17, 1971  MR#: [id number]  [id number]  Patient Care Team: Rusty Aus, MD as PCP - General (Internal Medicine)  CHIEF COMPLAINT: Wegener's granulomatosis.   INTERVAL HISTORY: Patient is a 48 year old male recently diagnosed with Wegener's granulomatosis.  He is referred to clinic today for consideration of treatment with IV Rituxan.  He currently feels well and is asymptomatic.  He has no neurologic complaints.  He denies any recent fevers.  He has a good appetite and denies weight loss.  He has no chest pain, shortness of breath, cough, or hemoptysis.  He denies any nausea, vomiting, constipation, or diarrhea.  He has no urinary complaints.  Patient offers no specific complaints today.  REVIEW OF SYSTEMS:   Review of Systems  Constitutional: Negative.  Negative for fever, malaise/fatigue and weight loss.  Respiratory: Negative.  Negative for cough, hemoptysis and shortness of breath.   Cardiovascular: Negative for chest pain and leg swelling.  Gastrointestinal: Negative.  Negative for abdominal pain.  Genitourinary: Negative.  Negative for dysuria.  Musculoskeletal: Negative.  Negative for back pain.  Skin: Negative.  Negative for rash.  Neurological: Negative.  Negative for dizziness, focal weakness, weakness and headaches.  Psychiatric/Behavioral: Negative.  The patient is not nervous/anxious.     As per HPI. Otherwise, a complete review of systems is negative.  PAST MEDICAL HISTORY: Past Medical History:  Diagnosis Date  . Dysrhythmia    tachycardia   . Headache    sinus  . Wegener's granulomatosis (granulomatosis with polyangiitis) ([place])     PAST SURGICAL HISTORY: Past Surgical History:  Procedure Laterality Date  . ETHMOIDECTOMY Bilateral 11/09/2018   Procedure: Total ETHMOIDECTOMY with sphenoidotomy-Left Total Ethmoidectomy with Sphenoidectomy (Tissue  Removal)-Right;  Surgeon: Margaretha [facility], MD;  Location: [facility];  Service: ENT;  Laterality: Bilateral;  . FRONTAL SINUS EXPLORATION Bilateral 11/09/2018   Procedure: FRONTAL SINUSotomy;  Surgeon: Margaretha [facility], MD;  Location: [facility];  Service: ENT;  Laterality: Bilateral;  . IMAGE GUIDED SINUS SURGERY Bilateral 11/09/2018   Procedure: IMAGE GUIDED SINUS SURGERY;  Surgeon: Margaretha [facility], MD;  Location: [facility];  Service: ENT;  Laterality: Bilateral;  NEED STRYKER DISK  . LUNG BIOPSY    . MAXILLARY ANTROSTOMY Bilateral 11/09/2018   Procedure: MAXILLARY ANTROSTOMY with removal of tissue;  Surgeon: Margaretha [facility], MD;  Location: [facility];  Service: ENT;  Laterality: Bilateral;  . SEPTOPLASTY Bilateral 11/09/2018   Procedure: SEPTOPLASTY;  Surgeon: Margaretha [facility], MD;  Location: [facility];  Service: ENT;  Laterality: Bilateral;  Gave disk to Brenda 9-30  . TURBINATE REDUCTION Bilateral 11/09/2018   Procedure: TURBINATE REDUCTION-inferior;  Surgeon: Margaretha [facility], MD;  Location: [facility];  Service: ENT;  Laterality: Bilateral;  . VIDEO BRONCHOSCOPY WITH ENDOBRONCHIAL NAVIGATION N/A 12/22/2018   Procedure: VIDEO BRONCHOSCOPY WITH ENDOBRONCHIAL NAVIGATION;  Surgeon: Ottie Glazier, MD;  Location: [facility];  Service: Thoracic;  Laterality: N/A;  . WISDOM TOOTH EXTRACTION      FAMILY HISTORY: No family history on file.  ADVANCED DIRECTIVES (Y/N):  N  HEALTH MAINTENANCE: Social History   Tobacco Use  . Smoking status: Former Smoker    Years: 14.00    Types: Cigarettes    Quit date: 2001    Years since quitting: 19.9  . Smokeless tobacco: Former Systems developer    Types: Chew    Quit date: 12/18/2017  Substance Use Topics  . Alcohol use: Not Currently  Alcohol/week: 3.0 standard drinks    Types: 3 Cans of beer per week  . Drug use: Never     Colonoscopy:  PAP:  Bone density:  Lipid panel:  No Known Allergies  Current  Outpatient Medications  Medication Sig Dispense Refill  . Fluticasone Propionate (XHANCE) 93 MCG/ACT EXHU Place 1 spray into both nostrils 2 (two) times daily.     . Multiple Vitamin (MULTIVITAMIN WITH MINERALS) TABS tablet Take 1 tablet by mouth daily.    . predniSONE (DELTASONE) 20 MG tablet Take 40 mg by mouth daily with breakfast.    . acetaminophen (TYLENOL) 325 MG tablet Take 650 mg by mouth every 6 (six) hours as needed.     No current facility-administered medications for this visit.     OBJECTIVE: Vitals:   01/08/19 1501  BP: 116/81  Pulse: 92  SpO2: 98%     Body mass index is 28.51 kg/m.    ECOG FS:0 - Asymptomatic  General: Well-developed, well-nourished, no acute distress. Eyes: Pink conjunctiva, anicteric sclera. HEENT: Normocephalic, moist mucous membranes, clear oropharnyx. Lungs: Clear to auscultation bilaterally. Heart: Regular rate and rhythm. No rubs, murmurs, or gallops. Abdomen: Soft, nontender, nondistended. No organomegaly noted, normoactive bowel sounds. Musculoskeletal: No edema, cyanosis, or clubbing. Neuro: Alert, answering all questions appropriately. Cranial nerves grossly intact. Skin: No rashes or petechiae noted. Psych: Normal affect. Lymphatics: No cervical, calvicular, axillary or inguinal LAD.   LAB RESULTS:  No results found for: NA, K, CL, CO2, GLUCOSE, BUN, CREATININE, CALCIUM, PROT, ALBUMIN, AST, ALT, ALKPHOS, BILITOT, GFRNONAA, GFRAA  Lab Results  Component Value Date   WBC 10.2 12/19/2018   HGB 12.4 (L) 12/19/2018   HCT 39.5 12/19/2018   MCV 92.7 12/19/2018   PLT 400 12/19/2018     STUDIES: No results found.  ASSESSMENT: Wegener's granulomatosis.   PLAN:   1. Wegener's granulomatosis: Patient currently on chronic steroids and likely will benefit from IV Rituxan.  We have not yet received prescription from rheumatology to determine dose or dosing schedule.  Most likely he will receive 1000 mg IV Rituxan on days 1 and 15  every 6 months.  Patient expressed understanding that all laboratory work and any symptoms related to his Wegener's will be monitored and evaluated per rheumatology.  Return to clinic once Rituxan prescription is received to initiate treatment.  I spent a total of 45 minutes face-to-face with the patient of which greater than 50% of the visit was spent in counseling and coordination of care as detailed above.   Patient expressed understanding and was in agreement with this plan. He also understands that He can call clinic at any time with any questions, concerns, or complaints.    Lloyd Huger, MD   01/09/2019 12:40 PM

## 2019-01-08 ENCOUNTER — Encounter: Payer: Self-pay | Admitting: Oncology

## 2019-01-08 ENCOUNTER — Other Ambulatory Visit: Payer: Self-pay

## 2019-01-08 ENCOUNTER — Inpatient Hospital Stay: Payer: 59 | Attending: Oncology | Admitting: Oncology

## 2019-01-08 DIAGNOSIS — M3130 Wegener's granulomatosis without renal involvement: Secondary | ICD-10-CM | POA: Diagnosis not present

## 2019-01-08 DIAGNOSIS — R000 Tachycardia, unspecified: Secondary | ICD-10-CM | POA: Insufficient documentation

## 2019-01-08 DIAGNOSIS — Z87891 Personal history of nicotine dependence: Secondary | ICD-10-CM | POA: Insufficient documentation

## 2019-01-08 NOTE — Progress Notes (Signed)
New patient consult Wegener's, with no concerns

## 2019-01-10 ENCOUNTER — Other Ambulatory Visit: Payer: Self-pay | Admitting: Oncology

## 2019-01-10 MED FILL — DOXYCYCLINE MONO 100 MG CAP: 100 | 14 days supply | Qty: 14 | Fill #0

## 2019-01-11 DIAGNOSIS — J3489 Other specified disorders of nose and nasal sinuses: Secondary | ICD-10-CM | POA: Diagnosis not present

## 2019-01-15 ENCOUNTER — Other Ambulatory Visit: Payer: Self-pay | Admitting: Emergency Medicine

## 2019-01-16 DIAGNOSIS — M3130 Wegener's granulomatosis without renal involvement: Secondary | ICD-10-CM | POA: Diagnosis not present

## 2019-01-16 DIAGNOSIS — Z23 Encounter for immunization: Secondary | ICD-10-CM | POA: Diagnosis not present

## 2019-01-16 DIAGNOSIS — R21 Rash and other nonspecific skin eruption: Secondary | ICD-10-CM | POA: Diagnosis not present

## 2019-01-16 DIAGNOSIS — I776 Arteritis, unspecified: Secondary | ICD-10-CM | POA: Diagnosis not present

## 2019-01-16 MED FILL — ACYCLOVIR 200 MG CAP: 200 | 10 days supply | Qty: 50 | Fill #0

## 2019-01-18 ENCOUNTER — Encounter: Payer: Self-pay | Admitting: Emergency Medicine

## 2019-01-21 NOTE — Progress Notes (Signed)
[facility]  Telephone:(336) [phone] Fax:(336) [phone]  ID: Thomas Gibbs OB: 1971/01/18  MR#: [id number]  [id number]  Patient Care Team: Rusty Aus, MD as PCP - General (Internal Medicine)  CHIEF COMPLAINT: Wegener's granulomatosis.   INTERVAL HISTORY: Patient returns to clinic today for further evaluation and consideration of cycle 1, day 1 of Ruxience.  He currently feels well and is at his baseline. He has no neurologic complaints.  He denies any recent fevers.  He has a good appetite and denies weight loss.  He has no chest pain, shortness of breath, cough, or hemoptysis.  He denies any nausea, vomiting, constipation, or diarrhea.  He has no urinary complaints.  Patient offers no specific complaints today.  REVIEW OF SYSTEMS:   Review of Systems  Constitutional: Negative.  Negative for fever, malaise/fatigue and weight loss.  Respiratory: Negative.  Negative for cough, hemoptysis and shortness of breath.   Cardiovascular: Negative for chest pain and leg swelling.  Gastrointestinal: Negative.  Negative for abdominal pain.  Genitourinary: Negative.  Negative for dysuria.  Musculoskeletal: Negative.  Negative for back pain.  Skin: Negative.  Negative for rash.  Neurological: Negative.  Negative for dizziness, focal weakness, weakness and headaches.  Psychiatric/Behavioral: Negative.  The patient is not nervous/anxious.     As per HPI. Otherwise, a complete review of systems is negative.  PAST MEDICAL HISTORY: Past Medical History:  Diagnosis Date  . Dysrhythmia    tachycardia   . Headache    sinus  . Wegener's granulomatosis (granulomatosis with polyangiitis) ([place])     PAST SURGICAL HISTORY: Past Surgical History:  Procedure Laterality Date  . ETHMOIDECTOMY Bilateral 11/09/2018   Procedure: Total ETHMOIDECTOMY with sphenoidotomy-Left Total Ethmoidectomy with Sphenoidectomy (Tissue Removal)-Right;  Surgeon: Margaretha [facility], MD;  Location:  [facility];  Service: ENT;  Laterality: Bilateral;  . FRONTAL SINUS EXPLORATION Bilateral 11/09/2018   Procedure: FRONTAL SINUSotomy;  Surgeon: Margaretha [facility], MD;  Location: [facility];  Service: ENT;  Laterality: Bilateral;  . IMAGE GUIDED SINUS SURGERY Bilateral 11/09/2018   Procedure: IMAGE GUIDED SINUS SURGERY;  Surgeon: Margare[facility], MD;  Location: [facility];  Service: ENT;  Laterality: Bilateral;  NEED STRYKER DISK  . LUNG BIOPSY    . MAXILLARY ANTROSTOMY Bilateral 11/09/2018   Procedure: MAXILLARY ANTROSTOMY with removal of tissue;  Surgeon: Margaretha [facility], MD;  Location: [facility];  Service: ENT;  Laterality: Bilateral;  . SEPTOPLASTY Bilateral 11/09/2018   Procedure: SEPTOPLASTY;  Surgeon: Margaretha [facility], MD;  Location: [facility];  Service: ENT;  Laterality: Bilateral;  Gave disk to Brenda 9-30  . TURBINATE REDUCTION Bilateral 11/09/2018   Procedure: TURBINATE REDUCTION-inferior;  Surgeon: Margaretha [facility], MD;  Location: [facility];  Service: ENT;  Laterality: Bilateral;  . VIDEO BRONCHOSCOPY WITH ENDOBRONCHIAL NAVIGATION N/A 12/22/2018   Procedure: VIDEO BRONCHOSCOPY WITH ENDOBRONCHIAL NAVIGATION;  Surgeon: Ottie Glazier, MD;  Location: [facility];  Service: Thoracic;  Laterality: N/A;  . WISDOM TOOTH EXTRACTION      FAMILY HISTORY: History reviewed. No pertinent family history.  ADVANCED DIRECTIVES (Y/N):  N  HEALTH MAINTENANCE: Social History   Tobacco Use  . Smoking status: Former Smoker    Years: 14.00    Types: Cigarettes    Quit date: 2001    Years since quitting: 19.9  . Smokeless tobacco: Former Systems developer    Types: Chew    Quit date: 12/18/2017  Substance Use Topics  . Alcohol use: Not Currently    Alcohol/week: 3.0  standard drinks    Types: 3 Cans of beer per week  . Drug use: Never     Colonoscopy:  PAP:  Bone density:  Lipid panel:  No Known Allergies  Current Outpatient Medications  Medication Sig  Dispense Refill  . acetaminophen (TYLENOL) 325 MG tablet Take 650 mg by mouth every 6 (six) hours as needed.    Marland Kitchen acyclovir (ZOVIRAX) 200 MG capsule Take by mouth.    . doxycycline (MONODOX) 100 MG capsule Take by mouth.    . Fluticasone Propionate (XHANCE) 93 MCG/ACT EXHU Place 1 spray into both nostrils 2 (two) times daily.     Marland Kitchen ibuprofen (ADVIL) 600 MG tablet Take by mouth.    . Multiple Vitamin (MULTIVITAMIN WITH MINERALS) TABS tablet Take 1 tablet by mouth daily.    . predniSONE (DELTASONE) 20 MG tablet Take 40 mg by mouth daily with breakfast.    . predniSONE (STERAPRED UNI-PAK 21 TAB) 10 MG (21) TBPK tablet      No current facility-administered medications for this visit.    OBJECTIVE: Vitals:   01/22/19 0918  BP: 120/83  Pulse: 90  Resp: 18  Temp: (!) 96.3 F (35.7 C)  SpO2: 100%     Body mass index is 29.08 kg/m.    ECOG FS:0 - Asymptomatic  General: Well-developed, well-nourished, no acute distress. Eyes: Pink conjunctiva, anicteric sclera. HEENT: Normocephalic, moist mucous membranes. Lungs: No audible wheezing or coughing. Heart: Regular rate and rhythm. Abdomen: Soft, nontender, no obvious distention. Musculoskeletal: No edema, cyanosis, or clubbing. Neuro: Alert, answering all questions appropriately. Cranial nerves grossly intact. Skin: No rashes or petechiae noted. Psych: Normal affect.  LAB RESULTS:  No results found for: NA, K, CL, CO2, GLUCOSE, BUN, CREATININE, CALCIUM, PROT, ALBUMIN, AST, ALT, ALKPHOS, BILITOT, GFRNONAA, GFRAA  Lab Results  Component Value Date   WBC 10.2 12/19/2018   HGB 12.4 (L) 12/19/2018   HCT 39.5 12/19/2018   MCV 92.7 12/19/2018   PLT 400 12/19/2018     STUDIES: No results found.  ASSESSMENT: Wegener's granulomatosis.   PLAN:   1. Wegener's granulomatosis: Patient currently on chronic steroids and likely will benefit from IV Ruxience (bio similar to Rituxan).  Patient expressed understanding that all laboratory work  and any symptoms related to his Wegener's will be monitored and evaluated per rheumatology.  Proceed with cycle 1, day 1 of 1000 mg of Ruxience today.  Return to clinic in 2 weeks for cycle 1, day 15 of treatment.  Patient will then return to clinic in 6 months for further evaluation and consideration of cycle 2, day 1 if continued treatment is recommended by rheumatology.    I spent a total of 25 minutes face-to-face with the patient and reviewing chart data of which greater than 50% of the visit was spent in counseling and coordination of care as detailed above.  Patient expressed understanding and was in agreement with this plan. He also understands that He can call clinic at any time with any questions, concerns, or complaints.    Lloyd Huger, MD   01/22/2019 11:43 AM

## 2019-01-22 ENCOUNTER — Inpatient Hospital Stay: Payer: 59

## 2019-01-22 ENCOUNTER — Encounter: Payer: Self-pay | Admitting: Emergency Medicine

## 2019-01-22 ENCOUNTER — Inpatient Hospital Stay: Payer: 59 | Attending: Oncology | Admitting: Oncology

## 2019-01-22 ENCOUNTER — Other Ambulatory Visit: Payer: Self-pay

## 2019-01-22 ENCOUNTER — Encounter: Payer: Self-pay | Admitting: Oncology

## 2019-01-22 VITALS — BP 122/74 | HR 96 | Temp 96.4°F | Resp 18

## 2019-01-22 VITALS — BP 120/83 | HR 90 | Temp 96.3°F | Resp 18 | Wt 202.7 lb

## 2019-01-22 DIAGNOSIS — Z5112 Encounter for antineoplastic immunotherapy: Secondary | ICD-10-CM | POA: Insufficient documentation

## 2019-01-22 DIAGNOSIS — R000 Tachycardia, unspecified: Secondary | ICD-10-CM | POA: Insufficient documentation

## 2019-01-22 DIAGNOSIS — Z79899 Other long term (current) drug therapy: Secondary | ICD-10-CM | POA: Insufficient documentation

## 2019-01-22 DIAGNOSIS — M3130 Wegener's granulomatosis without renal involvement: Secondary | ICD-10-CM | POA: Diagnosis not present

## 2019-01-22 DIAGNOSIS — Z7952 Long term (current) use of systemic steroids: Secondary | ICD-10-CM | POA: Insufficient documentation

## 2019-01-22 MED ORDER — METHYLPREDNISOLONE SODIUM SUCC 125 MG IJ SOLR
125.0000 mg | Freq: Once | INTRAMUSCULAR | Status: AC
  Administered 2019-01-22: 125 mg via INTRAVENOUS

## 2019-01-22 MED ORDER — SODIUM CHLORIDE 0.9 % IV SOLN
1000.0000 mg | Freq: Once | INTRAVENOUS | Status: AC
  Administered 2019-01-22: 12:00:00 1000 mg via INTRAVENOUS
  Filled 2019-01-22: qty 100

## 2019-01-22 MED ORDER — SODIUM CHLORIDE 0.9 % IV SOLN
Freq: Once | INTRAVENOUS | Status: AC
  Administered 2019-01-22: 13:00:00 via INTRAVENOUS
  Filled 2019-01-22: qty 250

## 2019-01-22 MED ORDER — METHYLPREDNISOLONE SODIUM SUCC 125 MG IJ SOLR
100.0000 mg | Freq: Once | INTRAMUSCULAR | Status: AC
  Administered 2019-01-22: 11:00:00 100 mg via INTRAVENOUS
  Filled 2019-01-22: qty 2

## 2019-01-22 MED ORDER — SODIUM CHLORIDE 0.9 % IV SOLN
Freq: Once | INTRAVENOUS | Status: AC
  Administered 2019-01-22: 10:00:00 via INTRAVENOUS
  Filled 2019-01-22: qty 250

## 2019-01-22 MED ORDER — DIPHENHYDRAMINE HCL 50 MG/ML IJ SOLN
50.0000 mg | Freq: Once | INTRAMUSCULAR | Status: AC
  Administered 2019-01-22: 50 mg via INTRAVENOUS
  Filled 2019-01-22: qty 1

## 2019-01-22 MED ORDER — ACETAMINOPHEN 325 MG PO TABS
650.0000 mg | ORAL_TABLET | Freq: Once | ORAL | Status: AC
  Administered 2019-01-22: 650 mg via ORAL
  Filled 2019-01-22: qty 2

## 2019-01-22 NOTE — Progress Notes (Signed)
Per MD, Dr. Grayland Ormond, order: no lab work needed today; proceed with scheduled Ruxience treatment at this time.  1308- Patient reports itching to his bilateral ears and eyes. Ruxience stopped. Vital signs stable, see vital sign flow sheet. NP, Beckey [facility], notified via telephone. Verbal orders received for 0.9% Sodium Chloride infusion at 999 ml/hr for 500 ml once over 30 minutes and Solu-medrol 125 mg IV push once, see MAR.  1323- NP, Beckey [facility], present at chair side to evaluate patient. Per NP order: if all symptoms are resolved after 500 ml 0.9% Sodium Chloride infusion is complete, Ruxience may be restarted at rate of 35 ml/hr at that time. Per NP order: okay to proceed with increasing rate per policy and as tolerated by patient.  1347- Patient reports feeling good. Patient states, "I feel back to the way I was before the itching. I do not have anymore itching right now." Vital signs stable, see vital sign flow sheet. NP, Beckey [facility], notified. Per NP order: restart Ruxience at 35 ml/hr at this time and proceed with increasing rate per policy and as tolerated by patient. Ruxience restarted at 1354. NP has updated MD, Dr. Grayland Ormond.

## 2019-01-25 ENCOUNTER — Encounter: Payer: Self-pay | Admitting: Oncology

## 2019-01-25 DIAGNOSIS — J328 Other chronic sinusitis: Secondary | ICD-10-CM | POA: Diagnosis not present

## 2019-01-26 MED FILL — XHANCE 93 MCG/ACT EXHU: 93 | 30 days supply | Qty: 16 | Fill #2

## 2019-01-30 MED FILL — predniSONE 20 MG TABS: 20 | 30 days supply | Qty: 60 | Fill #0

## 2019-01-31 ENCOUNTER — Other Ambulatory Visit: Payer: Self-pay

## 2019-02-04 LAB — ACID FAST CULTURE WITH REFLEXED SENSITIVITIES (MYCOBACTERIA): Acid Fast Culture: NEGATIVE

## 2019-02-05 ENCOUNTER — Other Ambulatory Visit: Payer: Self-pay

## 2019-02-05 ENCOUNTER — Inpatient Hospital Stay: Payer: 59

## 2019-02-05 VITALS — BP 112/73 | HR 86 | Temp 97.1°F | Resp 18 | Wt 202.2 lb

## 2019-02-05 DIAGNOSIS — Z7952 Long term (current) use of systemic steroids: Secondary | ICD-10-CM | POA: Diagnosis not present

## 2019-02-05 DIAGNOSIS — Z5112 Encounter for antineoplastic immunotherapy: Secondary | ICD-10-CM | POA: Diagnosis not present

## 2019-02-05 DIAGNOSIS — M3130 Wegener's granulomatosis without renal involvement: Secondary | ICD-10-CM | POA: Diagnosis not present

## 2019-02-05 DIAGNOSIS — Z79899 Other long term (current) drug therapy: Secondary | ICD-10-CM | POA: Diagnosis not present

## 2019-02-05 DIAGNOSIS — R000 Tachycardia, unspecified: Secondary | ICD-10-CM | POA: Diagnosis not present

## 2019-02-05 MED ORDER — SODIUM CHLORIDE 0.9 % IV SOLN
1000.0000 mg | Freq: Once | INTRAVENOUS | Status: AC
  Administered 2019-02-05: 1000 mg via INTRAVENOUS
  Filled 2019-02-05: qty 100

## 2019-02-05 MED ORDER — ACETAMINOPHEN 325 MG PO TABS
650.0000 mg | ORAL_TABLET | Freq: Once | ORAL | Status: AC
  Administered 2019-02-05: 650 mg via ORAL
  Filled 2019-02-05: qty 2

## 2019-02-05 MED ORDER — DIPHENHYDRAMINE HCL 25 MG PO CAPS
50.0000 mg | ORAL_CAPSULE | Freq: Once | ORAL | Status: AC
  Administered 2019-02-05: 50 mg via ORAL
  Filled 2019-02-05: qty 2

## 2019-02-05 MED ORDER — DIPHENHYDRAMINE HCL 50 MG/ML IJ SOLN: 50.0000 mg | Freq: Once | INTRAMUSCULAR | Status: DC

## 2019-02-05 MED ORDER — SODIUM CHLORIDE 0.9 % IV SOLN
Freq: Once | INTRAVENOUS | Status: AC
  Filled 2019-02-05: qty 250

## 2019-02-05 MED ORDER — METHYLPREDNISOLONE SODIUM SUCC 125 MG IJ SOLR
100.0000 mg | Freq: Once | INTRAMUSCULAR | Status: AC
  Administered 2019-02-05: 100 mg via INTRAVENOUS
  Filled 2019-02-05: qty 2

## 2019-02-05 NOTE — Progress Notes (Signed)
Per Darlyn Chamber RN per Dr. Grayland Ormond labs are determined by Rheumatology, okay to proceed with scheduled Ruxience without labs. Pt reports he has an appt scheduled with Rheumatology 02/06/2019. Pt requests PO Benadryl instead of IV Benadryl. Per Dr. Grayland Ormond okay to change Benadryl to PO.   1430: Pt tolerated Ruxience infusion well. Pt denies any concerns or complaints at this time. No s/s of distress noted. Pt educated to call clinic with any questions, pt verbalizes understanding.  Pt and VS stable at time of discharge.

## 2019-02-06 DIAGNOSIS — I776 Arteritis, unspecified: Secondary | ICD-10-CM | POA: Diagnosis not present

## 2019-02-06 DIAGNOSIS — R9389 Abnormal findings on diagnostic imaging of other specified body structures: Secondary | ICD-10-CM | POA: Diagnosis not present

## 2019-02-06 DIAGNOSIS — M3130 Wegener's granulomatosis without renal involvement: Secondary | ICD-10-CM | POA: Diagnosis not present

## 2019-02-06 DIAGNOSIS — R21 Rash and other nonspecific skin eruption: Secondary | ICD-10-CM | POA: Diagnosis not present

## 2019-02-06 DIAGNOSIS — J320 Chronic maxillary sinusitis: Secondary | ICD-10-CM | POA: Diagnosis not present

## 2019-02-06 MED FILL — MYCOPHENOLATE 500 MG TABLET: 500 | 30 days supply | Qty: 60 | Fill #0

## 2019-02-22 DIAGNOSIS — M3130 Wegener's granulomatosis without renal involvement: Secondary | ICD-10-CM | POA: Diagnosis not present

## 2019-02-22 DIAGNOSIS — J3489 Other specified disorders of nose and nasal sinuses: Secondary | ICD-10-CM | POA: Diagnosis not present

## 2019-03-06 DIAGNOSIS — M3130 Wegener's granulomatosis without renal involvement: Secondary | ICD-10-CM | POA: Diagnosis not present

## 2019-03-06 DIAGNOSIS — I776 Arteritis, unspecified: Secondary | ICD-10-CM | POA: Diagnosis not present

## 2019-03-06 DIAGNOSIS — R21 Rash and other nonspecific skin eruption: Secondary | ICD-10-CM | POA: Diagnosis not present

## 2019-03-06 DIAGNOSIS — Z0000 Encounter for general adult medical examination without abnormal findings: Secondary | ICD-10-CM | POA: Diagnosis not present

## 2019-03-06 DIAGNOSIS — R9389 Abnormal findings on diagnostic imaging of other specified body structures: Secondary | ICD-10-CM | POA: Diagnosis not present

## 2019-03-06 DIAGNOSIS — Z125 Encounter for screening for malignant neoplasm of prostate: Secondary | ICD-10-CM | POA: Diagnosis not present

## 2019-03-13 DIAGNOSIS — M3130 Wegener's granulomatosis without renal involvement: Secondary | ICD-10-CM | POA: Diagnosis not present

## 2019-03-13 DIAGNOSIS — I776 Arteritis, unspecified: Secondary | ICD-10-CM | POA: Diagnosis not present

## 2019-03-13 DIAGNOSIS — R21 Rash and other nonspecific skin eruption: Secondary | ICD-10-CM | POA: Diagnosis not present

## 2019-03-13 DIAGNOSIS — J329 Chronic sinusitis, unspecified: Secondary | ICD-10-CM | POA: Diagnosis not present

## 2019-03-13 MED FILL — predniSONE 5 MG TABS: 5 | 30 days supply | Qty: 90 | Fill #0

## 2019-03-15 DIAGNOSIS — Z0000 Encounter for general adult medical examination without abnormal findings: Secondary | ICD-10-CM | POA: Diagnosis not present

## 2019-03-28 DIAGNOSIS — J339 Nasal polyp, unspecified: Secondary | ICD-10-CM | POA: Diagnosis not present

## 2019-03-28 DIAGNOSIS — M3130 Wegener's granulomatosis without renal involvement: Secondary | ICD-10-CM | POA: Diagnosis not present

## 2019-03-28 DIAGNOSIS — J3489 Other specified disorders of nose and nasal sinuses: Secondary | ICD-10-CM | POA: Diagnosis not present

## 2019-05-10 DIAGNOSIS — L309 Dermatitis, unspecified: Secondary | ICD-10-CM | POA: Diagnosis not present

## 2019-05-10 DIAGNOSIS — L814 Other melanin hyperpigmentation: Secondary | ICD-10-CM | POA: Diagnosis not present

## 2019-05-10 DIAGNOSIS — L570 Actinic keratosis: Secondary | ICD-10-CM | POA: Diagnosis not present

## 2019-05-10 DIAGNOSIS — D225 Melanocytic nevi of trunk: Secondary | ICD-10-CM | POA: Diagnosis not present

## 2019-05-10 DIAGNOSIS — L821 Other seborrheic keratosis: Secondary | ICD-10-CM | POA: Diagnosis not present

## 2019-05-10 DIAGNOSIS — D692 Other nonthrombocytopenic purpura: Secondary | ICD-10-CM | POA: Diagnosis not present

## 2019-05-10 DIAGNOSIS — D1801 Hemangioma of skin and subcutaneous tissue: Secondary | ICD-10-CM | POA: Diagnosis not present

## 2019-05-10 MED FILL — TRIAMCINOLONE ACETONIDE 0.1: 0.1 | 30 days supply | Qty: 60 | Fill #0

## 2019-06-12 DIAGNOSIS — J329 Chronic sinusitis, unspecified: Secondary | ICD-10-CM | POA: Diagnosis not present

## 2019-06-12 DIAGNOSIS — I776 Arteritis, unspecified: Secondary | ICD-10-CM | POA: Diagnosis not present

## 2019-06-12 DIAGNOSIS — M3130 Wegener's granulomatosis without renal involvement: Secondary | ICD-10-CM | POA: Diagnosis not present

## 2019-06-12 DIAGNOSIS — R21 Rash and other nonspecific skin eruption: Secondary | ICD-10-CM | POA: Diagnosis not present

## 2019-06-19 DIAGNOSIS — I776 Arteritis, unspecified: Secondary | ICD-10-CM | POA: Diagnosis not present

## 2019-06-19 DIAGNOSIS — M3130 Wegener's granulomatosis without renal involvement: Secondary | ICD-10-CM | POA: Diagnosis not present

## 2019-06-19 DIAGNOSIS — J329 Chronic sinusitis, unspecified: Secondary | ICD-10-CM | POA: Diagnosis not present

## 2019-06-19 DIAGNOSIS — J3489 Other specified disorders of nose and nasal sinuses: Secondary | ICD-10-CM | POA: Diagnosis not present

## 2019-06-19 DIAGNOSIS — R9389 Abnormal findings on diagnostic imaging of other specified body structures: Secondary | ICD-10-CM | POA: Diagnosis not present

## 2019-07-13 ENCOUNTER — Other Ambulatory Visit: Payer: Self-pay | Admitting: Rheumatology

## 2019-07-13 ENCOUNTER — Other Ambulatory Visit: Payer: Self-pay

## 2019-07-13 ENCOUNTER — Ambulatory Visit: Payer: 59

## 2019-07-13 DIAGNOSIS — R9389 Abnormal findings on diagnostic imaging of other specified body structures: Secondary | ICD-10-CM

## 2019-07-13 DIAGNOSIS — R918 Other nonspecific abnormal finding of lung field: Secondary | ICD-10-CM | POA: Diagnosis not present

## 2019-07-13 DIAGNOSIS — I776 Arteritis, unspecified: Secondary | ICD-10-CM

## 2019-07-13 DIAGNOSIS — M3130 Wegener's granulomatosis without renal involvement: Secondary | ICD-10-CM

## 2019-07-23 ENCOUNTER — Ambulatory Visit: Payer: 59 | Admitting: Oncology

## 2019-07-23 ENCOUNTER — Ambulatory Visit: Payer: 59

## 2019-08-02 NOTE — Progress Notes (Deleted)
[place]  [phone] Fax:(336) [phone]  ID: Jory Ee OB: March 21, 1970  MR#: [id number]  UQJ#:[id number]  Patient Care Team: Rusty Aus, MD as PCP - General (Internal Medicine)  CHIEF COMPLAINT: Wegener's granulomatosis.   INTERVAL HISTORY: Patient returns to clinic today for further evaluation and consideration of cycle 1, day 1 of Ruxience.  He currently feels well and is at his baseline. He has no neurologic complaints.  He denies any recent fevers.  He has a good appetite and denies weight loss.  He has no chest pain, shortness of breath, cough, or hemoptysis.  He denies any nausea, vomiting, constipation, or diarrhea.  He has no urinary complaints.  Patient offers no specific complaints today.  REVIEW OF SYSTEMS:   Review of Systems  Constitutional: Negative.  Negative for fever, malaise/fatigue and weight loss.  Respiratory: Negative.  Negative for cough, hemoptysis and shortness of breath.   Cardiovascular: Negative for chest pain and leg swelling.  Gastrointestinal: Negative.  Negative for abdominal pain.  Genitourinary: Negative.  Negative for dysuria.  Musculoskeletal: Negative.  Negative for back pain.  Skin: Negative.  Negative for rash.  Neurological: Negative.  Negative for dizziness, focal weakness, weakness and headaches.  Psychiatric/Behavioral: Negative.  The patient is not nervous/anxious.     As per HPI. Otherwise, a complete review of systems is negative.  PAST MEDICAL HISTORY: Past Medical History:  Diagnosis Date  . Dysrhythmia    tachycardia   . Headache    sinus  . Wegener's granulomatosis (granulomatosis with polyangiitis) ([place])     PAST SURGICAL HISTORY: Past Surgical History:  Procedure Laterality Date  . ETHMOIDECTOMY Bilateral 11/09/2018   Procedure: Total ETHMOIDECTOMY with sphenoidotomy-Left Total Ethmoidectomy with Sphenoidectomy (Tissue Removal)-Right;  Surgeon: Margaretha Sheffield, MD;  Location:  [place];  Service: ENT;  Laterality: Bilateral;  . FRONTAL SINUS EXPLORATION Bilateral 11/09/2018   Procedure: FRONTAL SINUSotomy;  Surgeon: Margaretha Sheffield, MD;  Location: [place];  Service: ENT;  Laterality: Bilateral;  . IMAGE GUIDED SINUS SURGERY Bilateral 11/09/2018   Procedure: IMAGE GUIDED SINUS SURGERY;  Surgeon: Margaretha Sheffield, MD;  Location: [place];  Service: ENT;  Laterality: Bilateral;  NEED STRYKER DISK  . LUNG BIOPSY    . MAXILLARY ANTROSTOMY Bilateral 11/09/2018   Procedure: MAXILLARY ANTROSTOMY with removal of tissue;  Surgeon: Margaretha Sheffield, MD;  Location: [place];  Service: ENT;  Laterality: Bilateral;  . SEPTOPLASTY Bilateral 11/09/2018   Procedure: SEPTOPLASTY;  Surgeon: Margaretha Sheffield, MD;  Location: [place];  Service: ENT;  Laterality: Bilateral;  Gave disk to Brenda 9-30  . TURBINATE REDUCTION Bilateral 11/09/2018   Procedure: TURBINATE REDUCTION-inferior;  Surgeon: Margaretha Sheffield, MD;  Location: [place];  Service: ENT;  Laterality: Bilateral;  . VIDEO BRONCHOSCOPY WITH ENDOBRONCHIAL NAVIGATION N/A 12/22/2018   Procedure: VIDEO BRONCHOSCOPY WITH ENDOBRONCHIAL NAVIGATION;  Surgeon: Ottie Glazier, MD;  Location: [facility];  Service: Thoracic;  Laterality: N/A;  . WISDOM TOOTH EXTRACTION      FAMILY HISTORY: No family history on file.  ADVANCED DIRECTIVES (Y/N):  N  HEALTH MAINTENANCE: Social History   Tobacco Use  . Smoking status: Former Smoker    Years: 14.00    Types: Cigarettes    Quit date: 2001    Years since quitting: 20.4  . Smokeless tobacco: Former Systems developer    Types: [place] date: 12/18/2017  Vaping Use  . Vaping Use: Never used  Substance Use Topics  . Alcohol  use: Not Currently    Alcohol/week: 3.0 standard drinks    Types: 3 Cans of beer per week  . Drug use: Never     Colonoscopy:  PAP:  Bone density:  Lipid panel:  No Known Allergies  Current Outpatient Medications    Medication Sig Dispense Refill  . acetaminophen (TYLENOL) 325 MG tablet Take 650 mg by mouth every 6 (six) hours as needed.    Marland Kitchen acyclovir (ZOVIRAX) 200 MG capsule Take by mouth.    . Fluticasone Propionate (XHANCE) 93 MCG/ACT EXHU Place 1 spray into both nostrils 2 (two) times daily.     Marland Kitchen ibuprofen (ADVIL) 600 MG tablet Take by mouth.    . Multiple Vitamin (MULTIVITAMIN WITH MINERALS) TABS tablet Take 1 tablet by mouth daily.    . predniSONE (DELTASONE) 20 MG tablet Take 40 mg by mouth daily with breakfast.    . predniSONE (STERAPRED UNI-PAK 21 TAB) 10 MG (21) TBPK tablet      No current facility-administered medications for this visit.    OBJECTIVE: There were no vitals filed for this visit.   There is no height or weight on file to calculate BMI.    ECOG FS:0 - Asymptomatic  General: Well-developed, well-nourished, no acute distress. Eyes: Pink conjunctiva, anicteric sclera. HEENT: Normocephalic, moist mucous membranes. Lungs: No audible wheezing or coughing. Heart: Regular rate and rhythm. Abdomen: Soft, nontender, no obvious distention. Musculoskeletal: No edema, cyanosis, or clubbing. Neuro: Alert, answering all questions appropriately. Cranial nerves grossly intact. Skin: No rashes or petechiae noted. Psych: Normal affect.  LAB RESULTS:  No results found for: NA, K, CL, CO2, GLUCOSE, BUN, CREATININE, CALCIUM, PROT, ALBUMIN, AST, ALT, ALKPHOS, BILITOT, GFRNONAA, GFRAA  Lab Results  Component Value Date   WBC 10.2 12/19/2018   HGB 12.4 (L) 12/19/2018   HCT 39.5 12/19/2018   MCV 92.7 12/19/2018   PLT 400 12/19/2018     STUDIES: CT CHEST WO CONTRAST  Result Date: 07/13/2019 CLINICAL DATA:  Follow-up. History of Wegner's granulomatosis by report. EXAM: CT CHEST WITHOUT CONTRAST TECHNIQUE: Multidetector CT imaging of the chest was performed following the standard protocol without IV contrast. COMPARISON:  11/30/2018 FINDINGS: Cardiovascular: Normal noncontrast appearance  of the heart and great vessels, limited assessment given the absence of intravenous contrast. Mediastinum/Nodes: No thoracic inlet adenopathy. No axillary lymphadenopathy. Lungs/Pleura: Volume loss has developed in the RIGHT upper lobe along the mediastinal border. The central nodular area seen on the previous exam is improved. No visible cavitation in this location on the current study. Small areas of ground-glass and subtle nodularity in the RIGHT upper lobe peripheral to the area of concern seen on the prior imaging study and lateral to collapsed lung along the RIGHT mediastinal border. Airways show material within central and secondary bronchial structures in the RIGHT upper lobe. No consolidation aside from volume loss along the RIGHT mediastinal border. No pleural effusion. Airways are otherwise patent. Upper Abdomen: Incidental imaging of upper abdominal contents without acute abnormality. Musculoskeletal: No chest wall mass. IMPRESSION: 1. Interval development of volume loss in the RIGHT upper lobe along the mediastinal border. The central nodular area seen on the previous exam is improved. No visible cavitation in this location on the current study. 2. Small areas of ground-glass and subtle nodularity in the RIGHT upper lobe peripheral to the area of concern seen on the prior imaging study and lateral to collapsed lung along the RIGHT mediastinal border. Findings may be related to postobstructive pneumonitis given filling of airways subtending  this area in the RIGHT upper lobe. Developing nodules in the setting of Wegner's is also a diagnostic possibility. Would consider short interval follow-up after therapy for further assessment as warranted. Electronically Signed   By: Zetta Bills M.D.   On: 07/13/2019 16:51    ASSESSMENT: Wegener's granulomatosis.   PLAN:   1. Wegener's granulomatosis: Patient currently on chronic steroids and likely will benefit from IV Ruxience (bio similar to Rituxan).   Patient expressed understanding that all laboratory work and any symptoms related to his Wegener's will be monitored and evaluated per rheumatology.  Proceed with cycle 1, day 1 of 1000 mg of Ruxience today.  Return to clinic in 2 weeks for cycle 1, day 15 of treatment.  Patient will then return to clinic in 6 months for further evaluation and consideration of cycle 2, day 1 if continued treatment is recommended by rheumatology.    I spent a total of 25 minutes face-to-face with the patient and reviewing chart data of which greater than 50% of the visit was spent in counseling and coordination of care as detailed above.  Patient expressed understanding and was in agreement with this plan. He also understands that He can call clinic at any time with any questions, concerns, or complaints.    Lloyd Huger, MD   08/02/2019 7:07 AM

## 2019-08-06 ENCOUNTER — Inpatient Hospital Stay: Payer: 59

## 2019-08-06 ENCOUNTER — Inpatient Hospital Stay: Payer: 59 | Admitting: Oncology

## 2019-08-07 DIAGNOSIS — D7218 Eosinophilia in diseases classified elsewhere: Secondary | ICD-10-CM | POA: Diagnosis not present

## 2019-08-07 DIAGNOSIS — M301 Polyarteritis with lung involvement [Churg-Strauss]: Secondary | ICD-10-CM | POA: Diagnosis not present

## 2019-08-12 NOTE — Progress Notes (Signed)
[place]  Telephone:(336) [phone] Fax:(336) [phone]  ID: Jory Ee OB: [birth year]  MR#: [id number]  YBO#:[id number]  Patient Care Team: Rusty Aus, MD as PCP - General (Internal Medicine)  CHIEF COMPLAINT: Wegener's granulomatosis.   INTERVAL HISTORY: Patient returns to clinic today for further evaluation and continuation of Ruxience.  He continues to feel well and remains asymptomatic.  He does not complain of any shortness of breath today. He has no neurologic complaints.  He denies any recent fevers.  He has a good appetite and denies weight loss.  He has no chest pain, shortness of breath, cough, or hemoptysis.  He denies any nausea, vomiting, constipation, or diarrhea.  He has no urinary complaints.  Patient offers no specific complaints today.  REVIEW OF SYSTEMS:   Review of Systems  Constitutional: Negative.  Negative for fever, malaise/fatigue and weight loss.  Respiratory: Negative.  Negative for cough, hemoptysis and shortness of breath.   Cardiovascular: Negative for chest pain and leg swelling.  Gastrointestinal: Negative.  Negative for abdominal pain.  Genitourinary: Negative.  Negative for dysuria.  Musculoskeletal: Negative.  Negative for back pain.  Skin: Negative.  Negative for rash.  Neurological: Negative.  Negative for dizziness, focal weakness, weakness and headaches.  Psychiatric/Behavioral: Negative.  The patient is not nervous/anxious.     As per HPI. Otherwise, a complete review of systems is negative.  PAST MEDICAL HISTORY: Past Medical History:  Diagnosis Date  . Dysrhythmia    tachycardia   . Headache    sinus  . Wegener's granulomatosis (granulomatosis with polyangiitis) ([place])     PAST SURGICAL HISTORY: Past Surgical History:  Procedure Laterality Date  . ETHMOIDECTOMY Bilateral 11/09/2018   Procedure: Total ETHMOIDECTOMY with sphenoidotomy-Left Total Ethmoidectomy with Sphenoidectomy (Tissue Removal)-Right;   Surgeon: Margaretha Sheffield, MD;  Location: [place];  Service: ENT;  Laterality: Bilateral;  . FRONTAL SINUS EXPLORATION Bilateral 11/09/2018   Procedure: FRONTAL SINUSotomy;  Surgeon: Margaretha Sheffield, MD;  Location: [place];  Service: ENT;  Laterality: Bilateral;  . IMAGE GUIDED SINUS SURGERY Bilateral 11/09/2018   Procedure: IMAGE GUIDED SINUS SURGERY;  Surgeon: Margaretha Sheffield, MD;  Location: [place];  Service: ENT;  Laterality: Bilateral;  NEED STRYKER DISK  . LUNG BIOPSY    . MAXILLARY ANTROSTOMY Bilateral 11/09/2018   Procedure: MAXILLARY ANTROSTOMY with removal of tissue;  Surgeon: Margaretha Sheffield, MD;  Location: [place];  Service: ENT;  Laterality: Bilateral;  . SEPTOPLASTY Bilateral 11/09/2018   Procedure: SEPTOPLASTY;  Surgeon: Margaretha Sheffield, MD;  Location: [place];  Service: ENT;  Laterality: Bilateral;  Gave disk to Brenda 9-30  . TURBINATE REDUCTION Bilateral 11/09/2018   Procedure: TURBINATE REDUCTION-inferior;  Surgeon: Margaretha Sheffield, MD;  Location: [place];  Service: ENT;  Laterality: Bilateral;  . VIDEO BRONCHOSCOPY WITH ENDOBRONCHIAL NAVIGATION N/A 12/22/2018   Procedure: VIDEO BRONCHOSCOPY WITH ENDOBRONCHIAL NAVIGATION;  Surgeon: Ottie Glazier, MD;  Location: [facility];  Service: Thoracic;  Laterality: N/A;  . WISDOM TOOTH EXTRACTION      FAMILY HISTORY: No family history on file.  ADVANCED DIRECTIVES (Y/N):  N  HEALTH MAINTENANCE: Social History   Tobacco Use  . Smoking status: Former Smoker    Years: 14.00    Types: Cigarettes    Quit date: 2001    Years since quitting: 20.5  . Smokeless tobacco: Former Systems developer    Types: [place] date: 12/18/2017  Vaping Use  . Vaping Use: Never used  Substance  Use Topics  . Alcohol use: Not Currently    Alcohol/week: 3.0 standard drinks    Types: 3 Cans of beer per week  . Drug use: Never     Colonoscopy:  PAP:  Bone density:  Lipid panel:  No Known  Allergies  Current Outpatient Medications  Medication Sig Dispense Refill  . acyclovir (ZOVIRAX) 200 MG capsule Take by mouth.    . predniSONE (DELTASONE) 5 MG tablet Take by mouth.    Marland Kitchen acetaminophen (TYLENOL) 325 MG tablet Take 650 mg by mouth every 6 (six) hours as needed. (Patient not taking: Reported on 08/17/2019)    . Fluticasone Propionate (XHANCE) 93 MCG/ACT EXHU Place 1 spray into both nostrils 2 (two) times daily.  (Patient not taking: Reported on 08/17/2019)    . ibuprofen (ADVIL) 600 MG tablet Take by mouth. (Patient not taking: Reported on 08/17/2019)    . Multiple Vitamin (MULTIVITAMIN WITH MINERALS) TABS tablet Take 1 tablet by mouth daily. (Patient not taking: Reported on 08/17/2019)    . triamcinolone cream (KENALOG) 0.1 % APPLY TO THE AFFECTED AREA(S) TWICE DAILY FOR FLARE/ITCH (Patient not taking: Reported on 08/17/2019)     No current facility-administered medications for this visit.    OBJECTIVE: Vitals:   08/17/19 0843  BP: (!) 126/94  Pulse: 72  Temp: 97.9 F (36.6 C)  SpO2: 98%     Body mass index is 28.94 kg/m.    ECOG FS:0 - Asymptomatic  General: Well-developed, well-nourished, no acute distress. Eyes: Pink conjunctiva, anicteric sclera. HEENT: Normocephalic, moist mucous membranes. Lungs: No audible wheezing or coughing. Heart: Regular rate and rhythm. Abdomen: Soft, nontender, no obvious distention. Musculoskeletal: No edema, cyanosis, or clubbing. Neuro: Alert, answering all questions appropriately. Cranial nerves grossly intact. Skin: No rashes or petechiae noted. Psych: Normal affect.   LAB RESULTS:  No results found for: NA, K, CL, CO2, GLUCOSE, BUN, CREATININE, CALCIUM, PROT, ALBUMIN, AST, ALT, ALKPHOS, BILITOT, GFRNONAA, GFRAA  Lab Results  Component Value Date   WBC 10.2 12/19/2018   HGB 12.4 (L) 12/19/2018   HCT 39.5 12/19/2018   MCV 92.7 12/19/2018   PLT 400 12/19/2018     STUDIES: No results found.  ASSESSMENT: Wegener's  granulomatosis.   PLAN:   1. Wegener's granulomatosis: Proceed with 1000 mg IV Ruxience (bio similar to Rituxan) today.  Patient expressed understanding that all laboratory work and any symptoms related to his Wegener's will be monitored and evaluated per rheumatology.  Patient no longer requires day 15 of treatment.  Case discussed with Dr. Jefm Bryant today.  Return to clinic in 6 months for further evaluation and continuation of treatment if needed per rheumatology.  I spent a total of 30 minutes reviewing chart data, face-to-face evaluation with the patient, counseling and coordination of care as detailed above.   Patient expressed understanding and was in agreement with this plan. He also understands that He can call clinic at any time with any questions, concerns, or complaints.    Lloyd Huger, MD   08/17/2019 11:49 AM

## 2019-08-17 ENCOUNTER — Inpatient Hospital Stay: Payer: 59 | Attending: Oncology | Admitting: Oncology

## 2019-08-17 ENCOUNTER — Inpatient Hospital Stay: Payer: 59

## 2019-08-17 ENCOUNTER — Other Ambulatory Visit: Payer: Self-pay

## 2019-08-17 VITALS — BP 123/83 | HR 78 | Temp 96.7°F | Resp 20

## 2019-08-17 VITALS — BP 126/94 | HR 72 | Temp 97.9°F | Wt 201.7 lb

## 2019-08-17 DIAGNOSIS — M3130 Wegener's granulomatosis without renal involvement: Secondary | ICD-10-CM | POA: Diagnosis not present

## 2019-08-17 DIAGNOSIS — Z5112 Encounter for antineoplastic immunotherapy: Secondary | ICD-10-CM | POA: Diagnosis not present

## 2019-08-17 DIAGNOSIS — Z79899 Other long term (current) drug therapy: Secondary | ICD-10-CM | POA: Diagnosis not present

## 2019-08-17 MED ORDER — DIPHENHYDRAMINE HCL 50 MG/ML IJ SOLN: 50.0000 mg | Freq: Once | INTRAMUSCULAR | Status: DC

## 2019-08-17 MED ORDER — ACETAMINOPHEN 325 MG PO TABS
650.0000 mg | ORAL_TABLET | Freq: Once | ORAL | Status: AC
  Administered 2019-08-17: 650 mg via ORAL
  Filled 2019-08-17: qty 2

## 2019-08-17 MED ORDER — SODIUM CHLORIDE 0.9 % IV SOLN
Freq: Once | INTRAVENOUS | Status: AC
  Filled 2019-08-17: qty 250

## 2019-08-17 MED ORDER — DIPHENHYDRAMINE HCL 25 MG PO CAPS
50.0000 mg | ORAL_CAPSULE | Freq: Once | ORAL | Status: AC
  Administered 2019-08-17: 50 mg via ORAL
  Filled 2019-08-17: qty 2

## 2019-08-17 MED ORDER — SODIUM CHLORIDE 0.9 % IV SOLN
1000.0000 mg | Freq: Once | INTRAVENOUS | Status: AC
  Administered 2019-08-17: 1000 mg via INTRAVENOUS
  Filled 2019-08-17: qty 100

## 2019-08-17 MED ORDER — METHYLPREDNISOLONE SODIUM SUCC 125 MG IJ SOLR
100.0000 mg | Freq: Once | INTRAMUSCULAR | Status: AC
  Administered 2019-08-17: 100 mg via INTRAVENOUS
  Filled 2019-08-17: qty 2

## 2019-08-31 ENCOUNTER — Ambulatory Visit: Payer: 59

## 2019-09-05 ENCOUNTER — Encounter: Payer: Self-pay | Admitting: Oncology

## 2019-09-11 DIAGNOSIS — I776 Arteritis, unspecified: Secondary | ICD-10-CM | POA: Diagnosis not present

## 2019-09-11 DIAGNOSIS — M3130 Wegener's granulomatosis without renal involvement: Secondary | ICD-10-CM | POA: Diagnosis not present

## 2019-10-02 DIAGNOSIS — J320 Chronic maxillary sinusitis: Secondary | ICD-10-CM | POA: Diagnosis not present

## 2019-10-02 DIAGNOSIS — M3130 Wegener's granulomatosis without renal involvement: Secondary | ICD-10-CM | POA: Diagnosis not present

## 2019-10-02 DIAGNOSIS — H6980 Other specified disorders of Eustachian tube, unspecified ear: Secondary | ICD-10-CM | POA: Diagnosis not present

## 2019-10-23 DIAGNOSIS — J320 Chronic maxillary sinusitis: Secondary | ICD-10-CM | POA: Diagnosis not present

## 2019-10-23 DIAGNOSIS — H6980 Other specified disorders of Eustachian tube, unspecified ear: Secondary | ICD-10-CM | POA: Diagnosis not present

## 2019-10-23 DIAGNOSIS — M3130 Wegener's granulomatosis without renal involvement: Secondary | ICD-10-CM | POA: Diagnosis not present

## 2019-12-07 ENCOUNTER — Other Ambulatory Visit: Payer: Self-pay | Admitting: Pulmonary Disease

## 2019-12-07 DIAGNOSIS — M301 Polyarteritis with lung involvement [Churg-Strauss]: Secondary | ICD-10-CM | POA: Diagnosis not present

## 2019-12-07 DIAGNOSIS — D7218 Eosinophilia in diseases classified elsewhere: Secondary | ICD-10-CM | POA: Diagnosis not present

## 2019-12-09 ENCOUNTER — Encounter: Payer: Self-pay | Admitting: Physician Assistant

## 2019-12-09 ENCOUNTER — Other Ambulatory Visit: Payer: Self-pay | Admitting: Physician Assistant

## 2019-12-09 DIAGNOSIS — U071 COVID-19: Secondary | ICD-10-CM

## 2019-12-09 DIAGNOSIS — Z6828 Body mass index (BMI) 28.0-28.9, adult: Secondary | ICD-10-CM

## 2019-12-09 DIAGNOSIS — M3130 Wegener's granulomatosis without renal involvement: Secondary | ICD-10-CM

## 2019-12-09 NOTE — Progress Notes (Signed)
I connected by phone with Thomas Gibbs on 12/09/2019 at 11:11 AM to discuss the potential use of a new treatment for mild to moderate COVID-19 viral infection in non-hospitalized patients.  This patient is a 49 y.o. male that meets the FDA criteria for Emergency Use Authorization of COVID monoclonal antibody casirivimab/imdevimab or bamlamivimab/estevimab.  Has a (+) direct SARS-CoV-2 viral test result  Has mild or moderate COVID-19   Is NOT hospitalized due to COVID-19  Is within 10 days of symptom onset  Has at least one of the high risk factor(s) for progression to severe COVID-19 and/or hospitalization as defined in EUA.  Specific high risk criteria : BMI > 25 and Immunosuppressive Disease or Treatment   I have spoken and communicated the following to the patient or parent/caregiver regarding COVID monoclonal antibody treatment:  1. FDA has authorized the emergency use for the treatment of mild to moderate COVID-19 in adults and pediatric patients with positive results of direct SARS-CoV-2 viral testing who are 39 years of age and older weighing at least 40 kg, and who are at high risk for progressing to severe COVID-19 and/or hospitalization.  2. The significant known and potential risks and benefits of COVID monoclonal antibody, and the extent to which such potential risks and benefits are unknown.  3. Information on available alternative treatments and the risks and benefits of those alternatives, including clinical trials.  4. Patients treated with COVID monoclonal antibody should continue to self-isolate and use infection control measures (e.g., wear mask, isolate, social distance, avoid sharing personal items, clean and disinfect "high touch" surfaces, and frequent handwashing) according to CDC guidelines.   5. The patient or parent/caregiver has the option to accept or refuse COVID monoclonal antibody treatment.  After reviewing this information with the patient, the  patient has agreed to receive one of the available covid 19 monoclonal antibodies and will be provided an appropriate fact sheet prior to infusion.  Sx onset 10/27. Set up for infusion on 11/1 @ 1:30pm. Directions given to [facility]. Pt is aware that insurance will be charged an infusion fee. Pt is not vaccinated.   Angelena Form 12/09/2019 11:11 AM

## 2019-12-10 ENCOUNTER — Ambulatory Visit (HOSPITAL_COMMUNITY)
Admission: RE | Admit: 2019-12-10 | Discharge: 2019-12-10 | Disposition: A | Discharge status: Home / Self Care | Payer: 59 | Source: Ambulatory Visit | Attending: Pulmonary Disease | Admitting: Pulmonary Disease

## 2019-12-10 DIAGNOSIS — U071 COVID-19: Secondary | ICD-10-CM | POA: Insufficient documentation

## 2019-12-10 DIAGNOSIS — Z6828 Body mass index (BMI) 28.0-28.9, adult: Secondary | ICD-10-CM

## 2019-12-10 DIAGNOSIS — M3130 Wegener's granulomatosis without renal involvement: Secondary | ICD-10-CM | POA: Diagnosis not present

## 2019-12-10 MED ORDER — DIPHENHYDRAMINE HCL 50 MG/ML IJ SOLN: 50.0000 mg | Freq: Once | INTRAMUSCULAR | Status: DC | PRN

## 2019-12-10 MED ORDER — ALBUTEROL SULFATE HFA 108 (90 BASE) MCG/ACT IN AERS: 2.0000 | INHALATION_SPRAY | Freq: Once | RESPIRATORY_TRACT | Status: DC | PRN

## 2019-12-10 MED ORDER — SODIUM CHLORIDE 0.9 % IV SOLN: INTRAVENOUS | Status: DC | PRN

## 2019-12-10 MED ORDER — FAMOTIDINE IN NACL 20-0.9 MG/50ML-% IV SOLN: 20.0000 mg | Freq: Once | INTRAVENOUS | Status: DC | PRN

## 2019-12-10 MED ORDER — SOTROVIMAB 500 MG/8ML IV SOLN
500.0000 mg | Freq: Once | INTRAVENOUS | Status: AC
  Administered 2019-12-10: 500 mg via INTRAVENOUS

## 2019-12-10 MED ORDER — SODIUM CHLORIDE 0.9 % IV SOLN: Freq: Once | INTRAVENOUS | Status: DC

## 2019-12-10 MED ORDER — METHYLPREDNISOLONE SODIUM SUCC 125 MG IJ SOLR: 125.0000 mg | Freq: Once | INTRAMUSCULAR | Status: DC | PRN

## 2019-12-10 MED ORDER — EPINEPHRINE 0.3 MG/0.3ML IJ SOAJ: 0.3000 mg | Freq: Once | INTRAMUSCULAR | Status: DC | PRN

## 2019-12-10 NOTE — Progress Notes (Signed)
  Diagnosis: COVID-19  Physician: Dr. Vickii Chafe  Procedure: Sotrovimab infusion  Complications: No immediate complications noted.  Discharge: Discharged home   Acquanetta Chain 12/10/2019

## 2019-12-10 NOTE — Discharge Instructions (Signed)

## 2019-12-11 DIAGNOSIS — U071 COVID-19: Secondary | ICD-10-CM | POA: Diagnosis not present

## 2019-12-11 DIAGNOSIS — J208 Acute bronchitis due to other specified organisms: Secondary | ICD-10-CM | POA: Diagnosis not present

## 2019-12-11 DIAGNOSIS — R059 Cough, unspecified: Secondary | ICD-10-CM | POA: Diagnosis not present

## 2019-12-11 DIAGNOSIS — J209 Acute bronchitis, unspecified: Secondary | ICD-10-CM | POA: Diagnosis not present

## 2019-12-18 ENCOUNTER — Other Ambulatory Visit (HOSPITAL_COMMUNITY): Payer: Self-pay | Admitting: Otolaryngology

## 2019-12-18 DIAGNOSIS — U071 COVID-19: Secondary | ICD-10-CM | POA: Diagnosis not present

## 2019-12-18 DIAGNOSIS — M3130 Wegener's granulomatosis without renal involvement: Secondary | ICD-10-CM | POA: Diagnosis not present

## 2019-12-18 DIAGNOSIS — J320 Chronic maxillary sinusitis: Secondary | ICD-10-CM | POA: Diagnosis not present

## 2019-12-18 DIAGNOSIS — J208 Acute bronchitis due to other specified organisms: Secondary | ICD-10-CM | POA: Diagnosis not present

## 2019-12-19 MED FILL — levoFLOXacin 500 MG TABS: 500 | 10 days supply | Qty: 10 | Fill #0

## 2020-01-16 DIAGNOSIS — J9811 Atelectasis: Secondary | ICD-10-CM | POA: Diagnosis not present

## 2020-01-16 DIAGNOSIS — Z8616 Personal history of COVID-19: Secondary | ICD-10-CM | POA: Diagnosis not present

## 2020-01-16 DIAGNOSIS — U071 COVID-19: Secondary | ICD-10-CM | POA: Diagnosis not present

## 2020-01-23 ENCOUNTER — Other Ambulatory Visit: Payer: Self-pay | Admitting: Oncology

## 2020-02-12 ENCOUNTER — Other Ambulatory Visit (HOSPITAL_COMMUNITY): Payer: Self-pay | Admitting: Internal Medicine

## 2020-02-12 MED FILL — ACYCLOVIR 200 MG CAP: 200 | 10 days supply | Qty: 50 | Fill #0

## 2020-02-18 ENCOUNTER — Inpatient Hospital Stay: Payer: 59

## 2020-02-18 ENCOUNTER — Inpatient Hospital Stay: Payer: 59 | Attending: Oncology | Admitting: Oncology

## 2020-02-18 ENCOUNTER — Encounter: Payer: Self-pay | Admitting: Oncology

## 2020-02-18 VITALS — BP 127/79 | HR 92 | Resp 18

## 2020-02-18 VITALS — BP 115/88 | HR 68 | Temp 95.7°F | Wt 198.4 lb

## 2020-02-18 DIAGNOSIS — Z5112 Encounter for antineoplastic immunotherapy: Secondary | ICD-10-CM | POA: Diagnosis not present

## 2020-02-18 DIAGNOSIS — M3130 Wegener's granulomatosis without renal involvement: Secondary | ICD-10-CM

## 2020-02-18 DIAGNOSIS — Z79899 Other long term (current) drug therapy: Secondary | ICD-10-CM | POA: Diagnosis not present

## 2020-02-18 MED ORDER — DIPHENHYDRAMINE HCL 50 MG/ML IJ SOLN
50.0000 mg | Freq: Once | INTRAMUSCULAR | Status: AC
  Administered 2020-02-18: 50 mg via INTRAVENOUS
  Filled 2020-02-18: qty 1

## 2020-02-18 MED ORDER — SODIUM CHLORIDE 0.9 % IV SOLN
1000.0000 mg | Freq: Once | INTRAVENOUS | Status: AC
  Administered 2020-02-18: 1000 mg via INTRAVENOUS
  Filled 2020-02-18: qty 100

## 2020-02-18 MED ORDER — SODIUM CHLORIDE 0.9 % IV SOLN
Freq: Once | INTRAVENOUS | Status: AC
  Filled 2020-02-18: qty 250

## 2020-02-18 MED ORDER — METHYLPREDNISOLONE SODIUM SUCC 125 MG IJ SOLR
100.0000 mg | Freq: Once | INTRAMUSCULAR | Status: AC
  Administered 2020-02-18: 100 mg via INTRAVENOUS
  Filled 2020-02-18: qty 2

## 2020-02-18 MED ORDER — ACETAMINOPHEN 325 MG PO TABS
650.0000 mg | ORAL_TABLET | Freq: Once | ORAL | Status: AC
  Administered 2020-02-18: 650 mg via ORAL
  Filled 2020-02-18: qty 2

## 2020-02-18 NOTE — Progress Notes (Signed)
[place]  Telephone:(336) [phone] Fax:(336) [phone]  ID: Jory Ee OB: 02-23-1970  MR#: [id number]  NTI#:[id number]  Patient Care Team: Rusty Aus, MD as PCP - General (Internal Medicine)  CHIEF COMPLAINT: Wegener's granulomatosis.   INTERVAL HISTORY: Patient returns to clinic today for further evaluation and continuation of Ruxience.  He continues to feel well and remains asymptomatic.  He does not complain of any shortness of breath today. He has no neurologic complaints.  He denies any recent fevers.  He has a good appetite and denies weight loss.  He has no chest pain, shortness of breath, cough, or hemoptysis.  He denies any nausea, vomiting, constipation, or diarrhea.  He has no urinary complaints.  Patient offers no specific complaints today.  REVIEW OF SYSTEMS:   Review of Systems  Constitutional: Negative.  Negative for fever, malaise/fatigue and weight loss.  Respiratory: Negative.  Negative for cough, hemoptysis and shortness of breath.   Cardiovascular: Negative for chest pain and leg swelling.  Gastrointestinal: Negative.  Negative for abdominal pain.  Genitourinary: Negative.  Negative for dysuria.  Musculoskeletal: Negative.  Negative for back pain.  Skin: Negative.  Negative for rash.  Neurological: Negative.  Negative for dizziness, focal weakness, weakness and headaches.  Psychiatric/Behavioral: Negative.  The patient is not nervous/anxious.     As per HPI. Otherwise, a complete review of systems is negative.  PAST MEDICAL HISTORY: Past Medical History:  Diagnosis Date  . BMI 28.0-28.9,adult   . Dysrhythmia    tachycardia   . Headache    sinus  . Wegener's granulomatosis (granulomatosis with polyangiitis)     PAST SURGICAL HISTORY: Past Surgical History:  Procedure Laterality Date  . ETHMOIDECTOMY Bilateral 11/09/2018   Procedure: Total ETHMOIDECTOMY with sphenoidotomy-Left Total Ethmoidectomy with Sphenoidectomy (Tissue  Removal)-Right;  Surgeon: Margaretha Sheffield, MD;  Location: [place];  Service: ENT;  Laterality: Bilateral;  . FRONTAL SINUS EXPLORATION Bilateral 11/09/2018   Procedure: FRONTAL SINUSotomy;  Surgeon: Margaretha Sheffield, MD;  Location: [place];  Service: ENT;  Laterality: Bilateral;  . IMAGE GUIDED SINUS SURGERY Bilateral 11/09/2018   Procedure: IMAGE GUIDED SINUS SURGERY;  Surgeon: Margaretha Sheffield, MD;  Location: [place];  Service: ENT;  Laterality: Bilateral;  NEED STRYKER DISK  . LUNG BIOPSY    . MAXILLARY ANTROSTOMY Bilateral 11/09/2018   Procedure: MAXILLARY ANTROSTOMY with removal of tissue;  Surgeon: Margaretha Sheffield, MD;  Location: [place];  Service: ENT;  Laterality: Bilateral;  . SEPTOPLASTY Bilateral 11/09/2018   Procedure: SEPTOPLASTY;  Surgeon: Margaretha Sheffield, MD;  Location: [place];  Service: ENT;  Laterality: Bilateral;  Gave disk to Brenda 9-30  . TURBINATE REDUCTION Bilateral 11/09/2018   Procedure: TURBINATE REDUCTION-inferior;  Surgeon: Margaretha Sheffield, MD;  Location: [place];  Service: ENT;  Laterality: Bilateral;  . VIDEO BRONCHOSCOPY WITH ENDOBRONCHIAL NAVIGATION N/A 12/22/2018   Procedure: VIDEO BRONCHOSCOPY WITH ENDOBRONCHIAL NAVIGATION;  Surgeon: Ottie Glazier, MD;  Location: [facility];  Service: Thoracic;  Laterality: N/A;  . WISDOM TOOTH EXTRACTION      FAMILY HISTORY: No family history on file.  ADVANCED DIRECTIVES (Y/N):  N  HEALTH MAINTENANCE: Social History   Tobacco Use  . Smoking status: Former Smoker    Years: 14.00    Types: Cigarettes    Quit date: 2001    Years since quitting: 21.0  . Smokeless tobacco: Former Systems developer    Types: [place] date: 12/18/2017  Vaping Use  . Vaping Use:  Never used  Substance Use Topics  . Alcohol use: Not Currently    Alcohol/week: 3.0 standard drinks    Types: 3 Cans of beer per week  . Drug use: Never     Colonoscopy:  PAP:  Bone density:  Lipid  panel:  No Known Allergies  Current Outpatient Medications  Medication Sig Dispense Refill  . ibuprofen (ADVIL) 600 MG tablet Take by mouth.    . Multiple Vitamin (MULTIVITAMIN WITH MINERALS) TABS tablet Take 1 tablet by mouth daily.    . predniSONE (DELTASONE) 5 MG tablet Take by mouth.    . triamcinolone cream (KENALOG) 0.1 % APPLY TO THE AFFECTED AREA(S) TWICE DAILY FOR FLARE/ITCH    . acetaminophen (TYLENOL) 325 MG tablet Take 650 mg by mouth every 6 (six) hours as needed. (Patient not taking: Reported on 02/18/2020)    . acyclovir (ZOVIRAX) 200 MG capsule Take by mouth. (Patient not taking: Reported on 02/18/2020)    . Fluticasone Propionate (XHANCE) 93 MCG/ACT EXHU Place 1 spray into both nostrils 2 (two) times daily.  (Patient not taking: Reported on 02/18/2020)     No current facility-administered medications for this visit.    OBJECTIVE: Vitals:   02/18/20 0829  BP: 115/88  Pulse: 68  Temp: (!) 95.7 F (35.4 C)     Body mass index is 28.47 kg/m.    ECOG FS:0 - Asymptomatic  General: Well-developed, well-nourished, no acute distress. Eyes: Pink conjunctiva, anicteric sclera. HEENT: Normocephalic, moist mucous membranes. Lungs: No audible wheezing or coughing. Heart: Regular rate and rhythm. Abdomen: Soft, nontender, no obvious distention. Musculoskeletal: No edema, cyanosis, or clubbing. Neuro: Alert, answering all questions appropriately. Cranial nerves grossly intact. Skin: No rashes or petechiae noted. Psych: Normal affect.   LAB RESULTS:  No results found for: NA, K, CL, CO2, GLUCOSE, BUN, CREATININE, CALCIUM, PROT, ALBUMIN, AST, ALT, ALKPHOS, BILITOT, GFRNONAA, GFRAA  Lab Results  Component Value Date   WBC 10.2 12/19/2018   HGB 12.4 (L) 12/19/2018   HCT 39.5 12/19/2018   MCV 92.7 12/19/2018   PLT 400 12/19/2018     STUDIES: No results found.  ASSESSMENT: Wegener's granulomatosis.   PLAN:   1. Wegener's granulomatosis: Proceed with 1000 mg IV  Ruxience (bio similar to Rituxan) today.  Patient expressed understanding that all laboratory work and any symptoms related to his Wegener's will be monitored and evaluated per rheumatology.  Patient no longer requires day 15 of treatment.  Case discussed with Dr. Jefm Bryant today.  Return to clinic in 6 months for further evaluation and continuation of treatment if needed per rheumatology.  I spent a total of 30 minutes reviewing chart data, face-to-face evaluation with the patient, counseling and coordination of care as detailed above.   Patient expressed understanding and was in agreement with this plan. He also understands that He can call clinic at any time with any questions, concerns, or complaints.    Jacquelin Hawking, NP   02/18/2020 8:51 AM

## 2020-02-18 NOTE — Progress Notes (Signed)
Pt stable for discharge. VSS. Pt tolerated infusion well with no complications.   Yeraldi Fidler [employer]

## 2020-02-26 DIAGNOSIS — L814 Other melanin hyperpigmentation: Secondary | ICD-10-CM | POA: Diagnosis not present

## 2020-02-26 DIAGNOSIS — D225 Melanocytic nevi of trunk: Secondary | ICD-10-CM | POA: Diagnosis not present

## 2020-02-26 DIAGNOSIS — L570 Actinic keratosis: Secondary | ICD-10-CM | POA: Diagnosis not present

## 2020-02-26 DIAGNOSIS — L301 Dyshidrosis [pompholyx]: Secondary | ICD-10-CM | POA: Diagnosis not present

## 2020-02-26 DIAGNOSIS — D1801 Hemangioma of skin and subcutaneous tissue: Secondary | ICD-10-CM | POA: Diagnosis not present

## 2020-02-26 DIAGNOSIS — L821 Other seborrheic keratosis: Secondary | ICD-10-CM | POA: Diagnosis not present

## 2020-02-28 DIAGNOSIS — R300 Dysuria: Secondary | ICD-10-CM | POA: Diagnosis not present

## 2020-02-28 DIAGNOSIS — M545 Low back pain, unspecified: Secondary | ICD-10-CM | POA: Diagnosis not present

## 2020-03-04 DIAGNOSIS — J320 Chronic maxillary sinusitis: Secondary | ICD-10-CM | POA: Diagnosis not present

## 2020-03-04 DIAGNOSIS — M3130 Wegener's granulomatosis without renal involvement: Secondary | ICD-10-CM | POA: Diagnosis not present

## 2020-03-04 DIAGNOSIS — H6980 Other specified disorders of Eustachian tube, unspecified ear: Secondary | ICD-10-CM | POA: Diagnosis not present

## 2020-03-11 DIAGNOSIS — Z125 Encounter for screening for malignant neoplasm of prostate: Secondary | ICD-10-CM | POA: Diagnosis not present

## 2020-03-11 DIAGNOSIS — Z131 Encounter for screening for diabetes mellitus: Secondary | ICD-10-CM | POA: Diagnosis not present

## 2020-03-11 DIAGNOSIS — Z13220 Encounter for screening for lipoid disorders: Secondary | ICD-10-CM | POA: Diagnosis not present

## 2020-03-11 DIAGNOSIS — Z0000 Encounter for general adult medical examination without abnormal findings: Secondary | ICD-10-CM | POA: Diagnosis not present

## 2020-03-17 DIAGNOSIS — Z0000 Encounter for general adult medical examination without abnormal findings: Secondary | ICD-10-CM | POA: Diagnosis not present

## 2020-03-17 DIAGNOSIS — M3130 Wegener's granulomatosis without renal involvement: Secondary | ICD-10-CM | POA: Diagnosis not present

## 2020-03-17 DIAGNOSIS — I776 Arteritis, unspecified: Secondary | ICD-10-CM | POA: Diagnosis not present

## 2020-03-24 DIAGNOSIS — M3130 Wegener's granulomatosis without renal involvement: Secondary | ICD-10-CM | POA: Diagnosis not present

## 2020-03-24 DIAGNOSIS — J329 Chronic sinusitis, unspecified: Secondary | ICD-10-CM | POA: Diagnosis not present

## 2020-03-24 DIAGNOSIS — I776 Arteritis, unspecified: Secondary | ICD-10-CM | POA: Diagnosis not present

## 2020-04-15 DIAGNOSIS — H04222 Epiphora due to insufficient drainage, left lacrimal gland: Secondary | ICD-10-CM | POA: Diagnosis not present

## 2020-06-05 DIAGNOSIS — H04551 Acquired stenosis of right nasolacrimal duct: Secondary | ICD-10-CM | POA: Diagnosis not present

## 2020-06-05 DIAGNOSIS — H04552 Acquired stenosis of left nasolacrimal duct: Secondary | ICD-10-CM | POA: Diagnosis not present

## 2020-06-10 ENCOUNTER — Encounter: Payer: Self-pay | Admitting: *Deleted

## 2020-06-14 IMAGING — CT CT MAXILLOFACIAL W/O CM
3 series · 15 of 47 positions shown, 18 images · non-contrast
Comparison: Maxillofacial CT 10/20/2017

CLINICAL DATA: Chronic antritis. Additional history: Patient
reports chronic sinus infections.

EXAM:
CT MAXILLOFACIAL WITHOUT CONTRAST
TECHNIQUE: Multidetector CT images of the paranasal sinuses were obtained using
the standard protocol without intravenous contrast.

[Series 2: max soft · axial · 0.37mm/px · z∈[-277,-127]mm · 9 of 88 slices shown, 12 images]
[im 7/88  brain]
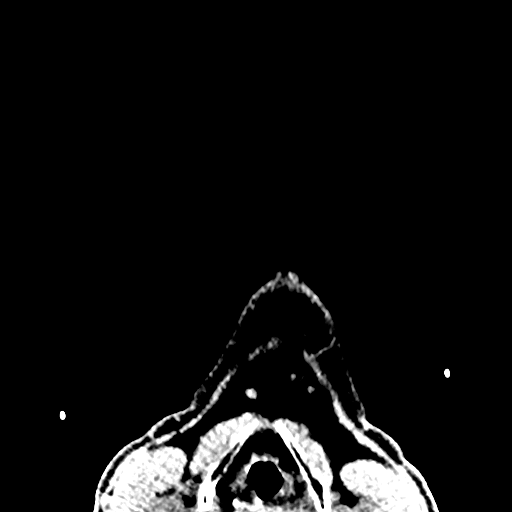
[im 7/88  bone]
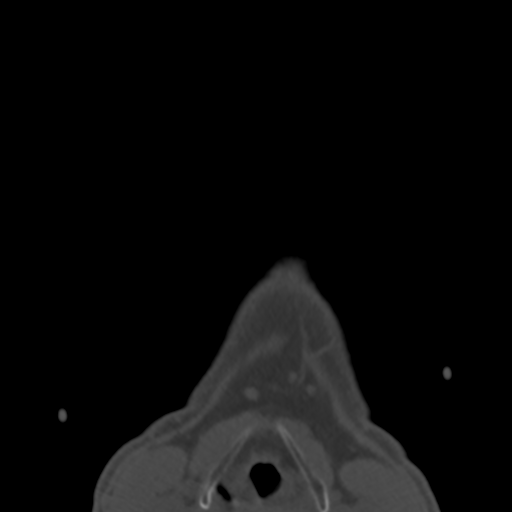
[im 16/88  bone]
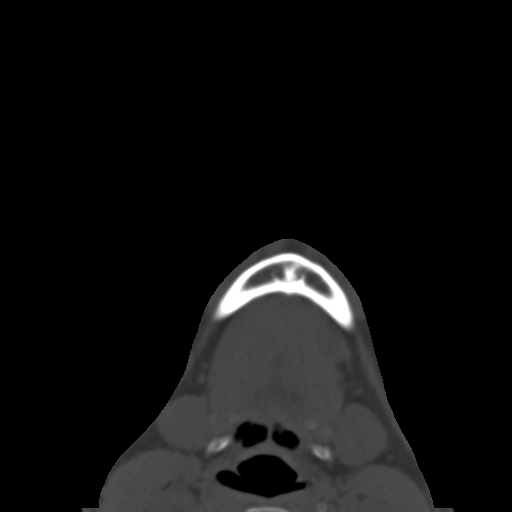
[im 25/88  bone]
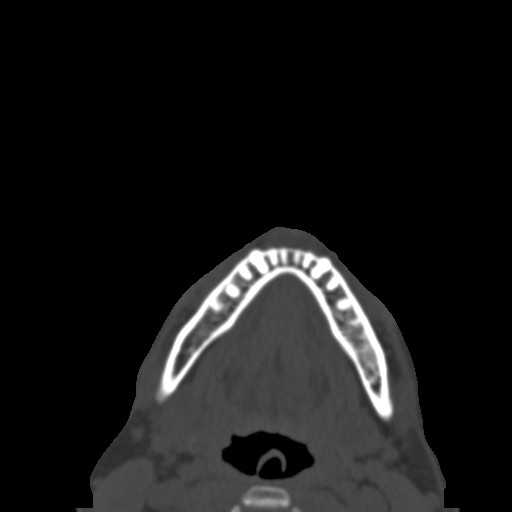
[im 34/88  bone]
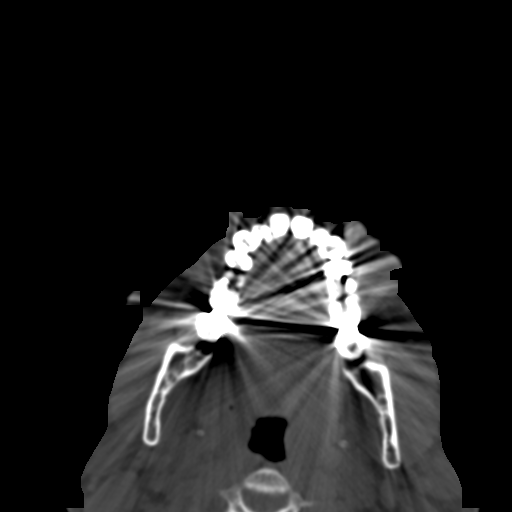
[im 46/88  brain]
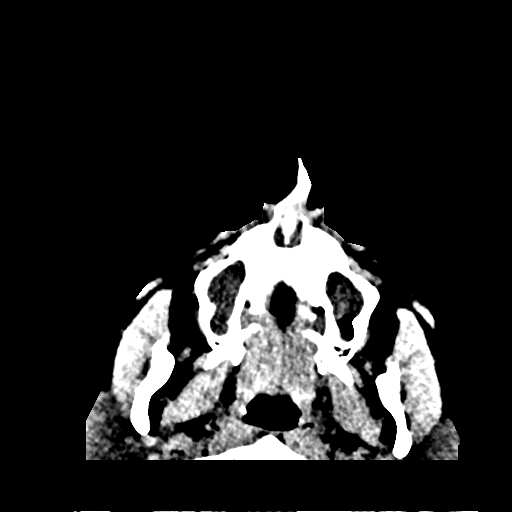
[im 46/88  bone]
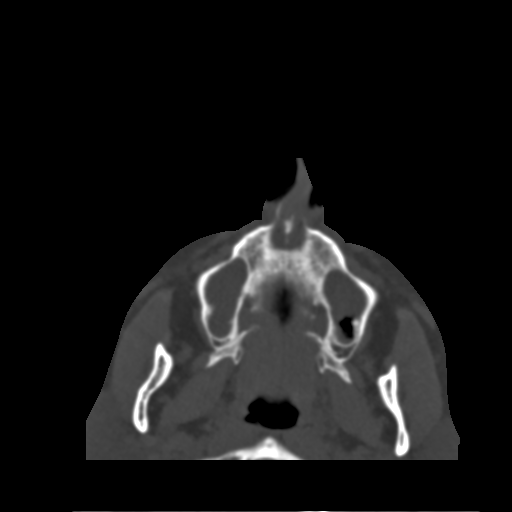
[im 55/88  bone]
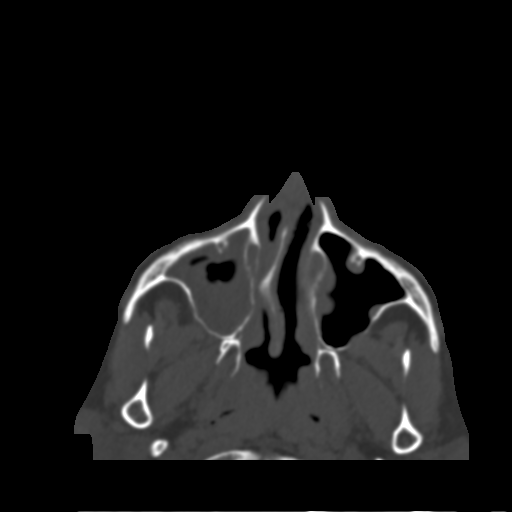
[im 64/88  bone]
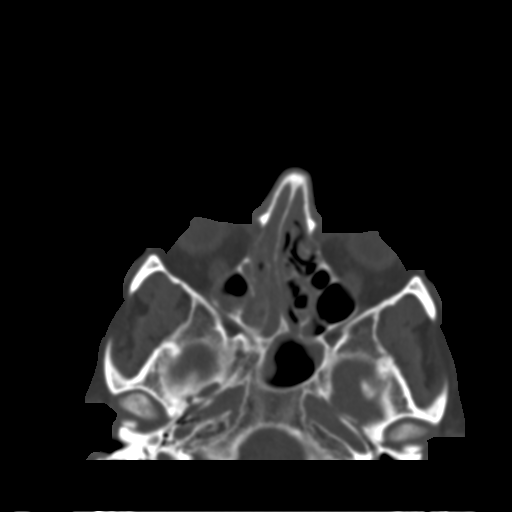
[im 73/88  bone]
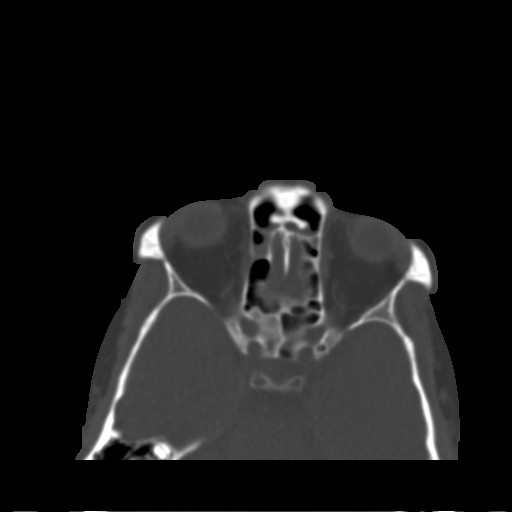
[im 82/88  brain]
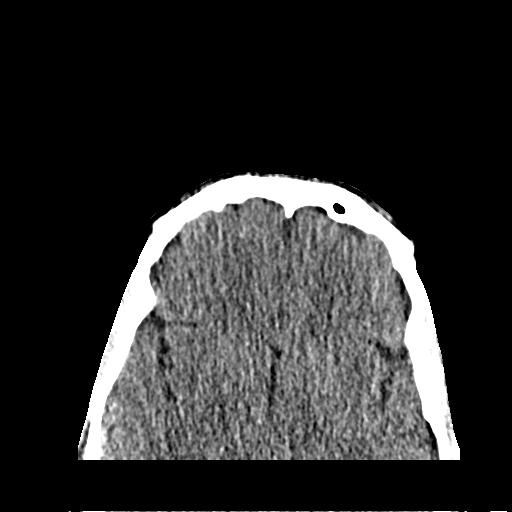
[im 82/88  bone]
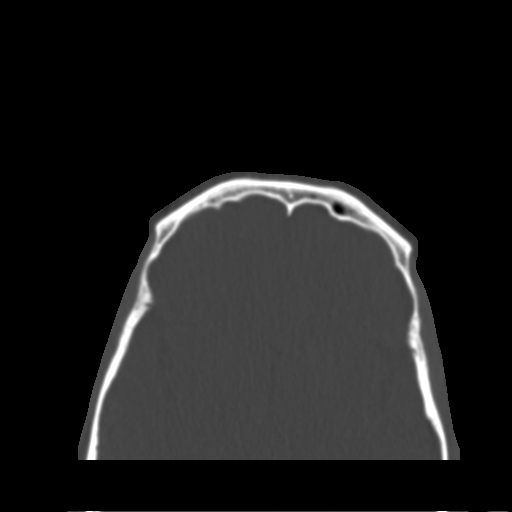

[Series 6: coronal soft · coronal · 0.34mm/px · 3 of 75 slices shown]
[im 25/75  bone]
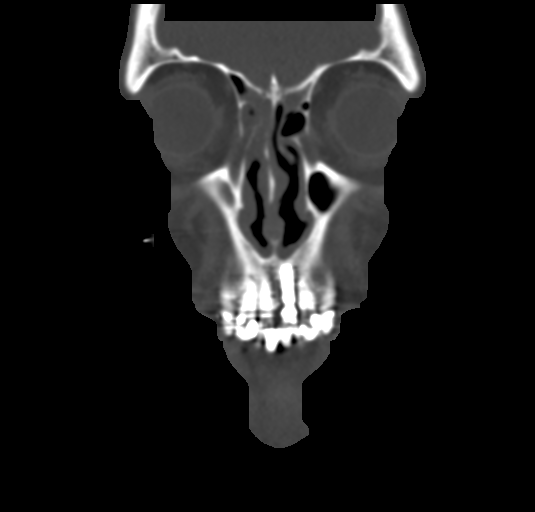
[im 33/75  bone]
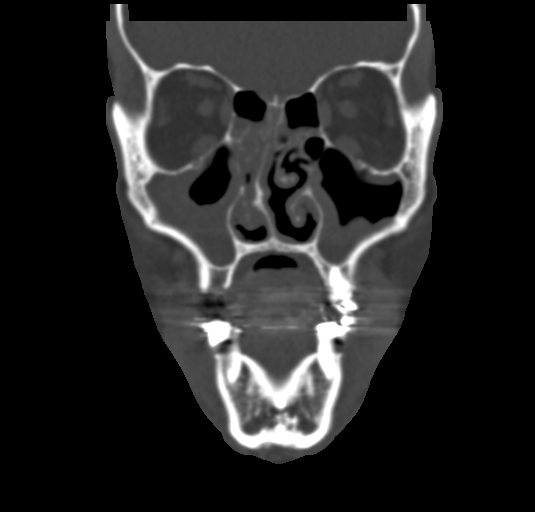
[im 42/75  bone]
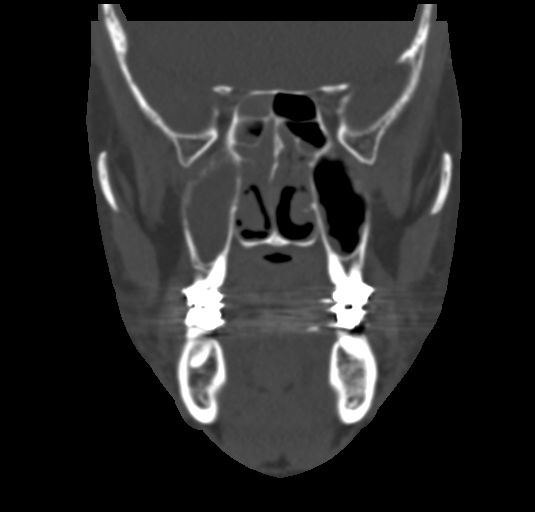

[Series 7: sagittal soft · sagittal · 0.35mm/px · 3 of 84 slices shown]
[im 28/84  bone]
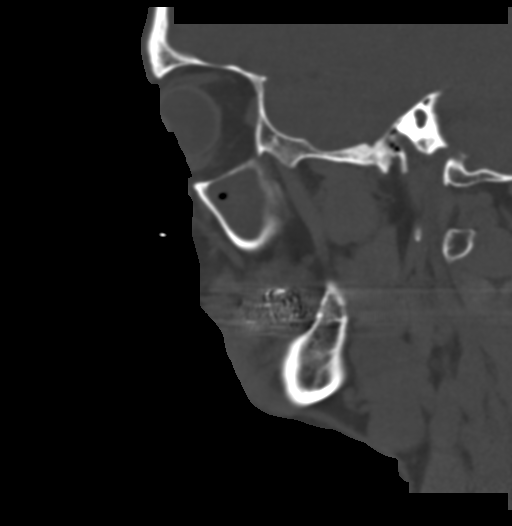
[im 42/84  bone]
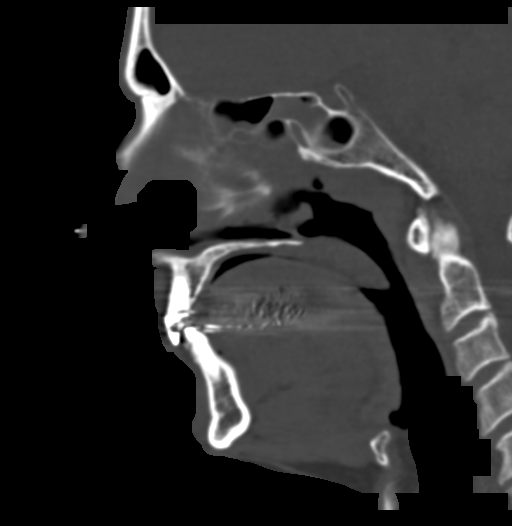
[im 56/84  bone]
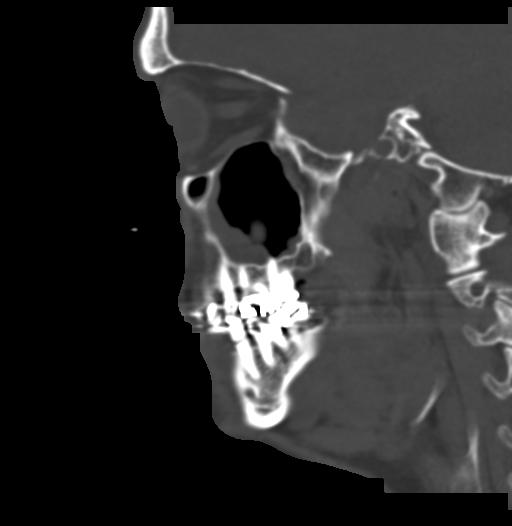

[15 of 47 positions shown; findings below may reference images not displayed]

FINDINGS: Paranasal sinuses:

Frontal: The right frontal sinus is well aerated. However, there is
near complete opacification of the right frontal sinus drainage
pathway. Minimal mucosal thickening within the inferior left frontal
sinus. Partial opacification of the left frontal sinus drainage
pathway.

Ethmoid: Mucosal thickening and partial opacification within
bilateral ethmoid air cells, greater on the right.

Maxillary: Moderate/severe mucosal thickening within the right
maxillary sinus as well as frothy secretions. Moderate mucosal
thickening within the inferior left maxillary sinus.

Sphenoid: Near complete opacification of the right sphenoid sinus
and right sphenoethmoidal recess. Mild mucosal thickening within the
left sphenoid sinus. The left sphenoid sinus ostium is obstructed by
mucosal thickening and/or secretions. Mucosal thickening within the
left sphenoethmoidal recess.

Right ostiomeatal unit: Extensive partial opacification of the right
maxillary sinus ostium, ethmoid infundibulum, hiatus semilunaris and
middle meatus. Additionally, there is partial opacification of the
anteroinferior right nasal passage.

Left ostiomeatal unit: Patent.

Nasal passages: Rightward deviation of the bony nasal septum with
rightward bony spurring.

Anatomy: No pneumatization superior to anterior ethmoid notches.
Symmetric and intact olfactory grooves and fovea ethmoidalis, Keros
I (1-3mm). Sellar sphenoid pneumatization pattern. No dehiscence of
carotid or optic canals. Onodi cell on the left, well aerated.

Other: Left mastoid effusion.
IMPRESSION: Paranasal sinus disease has significantly progressed since prior
maxillofacial CT 10/20/2017, as detailed within the body of the
report.

Most notably, there is moderate/severe mucosal thickening within the
right maxillary sinus as well as frothy secretions. Findings may
reflect acute on chronic sinusitis. There is also extensive partial
opacification of the right ostiomeatal unit.

Incidentally noted onodi cell on the left, well aerated.

Left mastoid effusion.

## 2020-07-02 DIAGNOSIS — H6982 Other specified disorders of Eustachian tube, left ear: Secondary | ICD-10-CM | POA: Diagnosis not present

## 2020-07-02 DIAGNOSIS — M3130 Wegener's granulomatosis without renal involvement: Secondary | ICD-10-CM | POA: Diagnosis not present

## 2020-07-02 DIAGNOSIS — J321 Chronic frontal sinusitis: Secondary | ICD-10-CM | POA: Diagnosis not present

## 2020-08-04 ENCOUNTER — Other Ambulatory Visit (HOSPITAL_COMMUNITY): Payer: Self-pay

## 2020-08-04 ENCOUNTER — Telehealth (INDEPENDENT_AMBULATORY_CARE_PROVIDER_SITE_OTHER): Payer: 59 | Admitting: Gastroenterology

## 2020-08-04 DIAGNOSIS — Z1211 Encounter for screening for malignant neoplasm of colon: Secondary | ICD-10-CM

## 2020-08-04 MED ORDER — NA SULFATE-K SULFATE-MG SULF 17.5-3.13-1.6 GM/177ML PO SOLN
1.0000 | Freq: Once | ORAL | 0 refills | Status: AC
  Filled 2020-08-04 – 2020-08-28 (×2): qty 354, 1d supply, fill #0

## 2020-08-04 NOTE — Progress Notes (Signed)
Gastroenterology Pre-Procedure Review  Request Date: 09/16/20 Requesting Physician: Dr. Allen Norris  PATIENT REVIEW QUESTIONS: The patient responded to the following health history questions as indicated:    1. Are you having any GI issues? no 2. Do you have a personal history of Polyps? no 3. Do you have a family history of Colon Cancer or Polyps? no 4. Diabetes Mellitus? no 5. Joint replacements in the past 12 months?no 6. Major health problems in the past 3 months?no 7. Any artificial heart valves, MVP, or defibrillator?no    MEDICATIONS & ALLERGIES:    Patient reports the following regarding taking any anticoagulation/antiplatelet therapy:   Plavix, Coumadin, Eliquis, Xarelto, Lovenox, Pradaxa, Brilinta, or Effient? no Aspirin? no  Patient confirms/reports the following medications:  Current Outpatient Medications  Medication Sig Dispense Refill   acetaminophen (TYLENOL) 325 MG tablet Take 650 mg by mouth every 6 (six) hours as needed. (Patient not taking: Reported on 02/18/2020)     acyclovir (ZOVIRAX) 200 MG capsule Take by mouth. (Patient not taking: Reported on 02/18/2020)     acyclovir (ZOVIRAX) 200 MG capsule TAKE 1 CAPSULE BY MOUTH 5 TIMES DAILY 50 capsule 1   Fluticasone Propionate (XHANCE) 93 MCG/ACT EXHU Place 1 spray into both nostrils 2 (two) times daily.  (Patient not taking: Reported on 02/18/2020)     ibuprofen (ADVIL) 600 MG tablet Take by mouth.     levofloxacin (LEVAQUIN) 500 MG tablet TAKE 1 TABLET BY MOUTH ONCE DAILY WITH FOOD FOR 10 DAYS 10 tablet 0   Multiple Vitamin (MULTIVITAMIN WITH MINERALS) TABS tablet Take 1 tablet by mouth daily.     predniSONE (DELTASONE) 20 MG tablet TAKE 1 TABLET (20 MG TOTAL) BY MOUTH ONCE DAILY 5 tablet 0   predniSONE (DELTASONE) 5 MG tablet Take by mouth.     triamcinolone cream (KENALOG) 0.1 % APPLY TO THE AFFECTED AREA(S) TWICE DAILY FOR FLARE/ITCH     No current facility-administered medications for this visit.    Patient  confirms/reports the following allergies:  No Known Allergies  No orders of the defined types were placed in this encounter.   AUTHORIZATION INFORMATION Primary Insurance: 1D#: Group #:  Secondary Insurance: 1D#: Group #:  SCHEDULE INFORMATION: Date: 09/16/20 Time: Location: [place]

## 2020-08-12 DIAGNOSIS — I776 Arteritis, unspecified: Secondary | ICD-10-CM | POA: Diagnosis not present

## 2020-08-12 DIAGNOSIS — M3130 Wegener's granulomatosis without renal involvement: Secondary | ICD-10-CM | POA: Diagnosis not present

## 2020-08-14 ENCOUNTER — Other Ambulatory Visit (HOSPITAL_COMMUNITY): Payer: Self-pay

## 2020-08-28 ENCOUNTER — Other Ambulatory Visit (HOSPITAL_COMMUNITY): Payer: Self-pay

## 2020-08-29 ENCOUNTER — Other Ambulatory Visit: Payer: Self-pay | Admitting: Oncology

## 2020-08-29 DIAGNOSIS — M3130 Wegener's granulomatosis without renal involvement: Secondary | ICD-10-CM

## 2020-09-01 NOTE — Progress Notes (Signed)
[place]  Telephone:(336) [phone] Fax:(336) [phone]  ID: Jory Ee OB: 1970-04-30  MR#: [id number]  [id number]  Patient Care Team: Rusty Aus, MD as PCP - General (Internal Medicine)  CHIEF COMPLAINT: Wegener's granulomatosis.   INTERVAL HISTORY: Patient returns to clinic today for routine 43-monthevaluation and continuation of Ruxience.  He continues to feel well and remains asymptomatic.  He has no neurologic complaints.  He denies any recent fevers or illnesses.  He has a good appetite and denies weight loss.  He has no chest pain, shortness of breath, cough, or hemoptysis.  He denies any nausea, vomiting, constipation, or diarrhea.  He has no urinary complaints.  Patient feels at his baseline offers no specific complaints today.  REVIEW OF SYSTEMS:   Review of Systems  Constitutional: Negative.  Negative for fever, malaise/fatigue and weight loss.  Respiratory: Negative.  Negative for cough, hemoptysis and shortness of breath.   Cardiovascular:  Negative for chest pain and leg swelling.  Gastrointestinal: Negative.  Negative for abdominal pain.  Genitourinary: Negative.  Negative for dysuria.  Musculoskeletal: Negative.  Negative for back pain.  Skin: Negative.  Negative for rash.  Neurological: Negative.  Negative for dizziness, focal weakness, weakness and headaches.  Psychiatric/Behavioral: Negative.  The patient is not nervous/anxious.    As per HPI. Otherwise, a complete review of systems is negative.  PAST MEDICAL HISTORY: Past Medical History:  Diagnosis Date   BMI 28.0-28.9,adult    Dysrhythmia    tachycardia    Headache    sinus   Wegener's granulomatosis (granulomatosis with polyangiitis)     PAST SURGICAL HISTORY: Past Surgical History:  Procedure Laterality Date   ETHMOIDECTOMY Bilateral 11/09/2018   Procedure: Total ETHMOIDECTOMY with sphenoidotomy-Left Total Ethmoidectomy with Sphenoidectomy (Tissue Removal)-Right;   Surgeon: JMargaretha Sheffield MD;  Location: [facility]  Service: ENT;  Laterality: Bilateral;   FRONTAL SINUS EXPLORATION Bilateral 11/09/2018   Procedure: FRONTAL SINUSotomy;  Surgeon: JMargaretha Sheffield MD;  [place] [facility]  Service: ENT;  Laterality: Bilateral;   IMAGE GUIDED SINUS SURGERY Bilateral 11/09/2018   Procedure: IMAGE GUIDED SINUS SURGERY;  Surgeon: JMargaretha Sheffield MD;  [place] [facility]  Service: ENT;  Laterality: Bilateral;  NEED STRYKER DISK   LUNG BIOPSY     MAXILLARY ANTROSTOMY Bilateral 11/09/2018   Procedure: MAXILLARY ANTROSTOMY with removal of tissue;  Surgeon: JMargaretha Sheffield MD;  [place] [facility]  Service: ENT;  Laterality: Bilateral;   SEPTOPLASTY Bilateral 11/09/2018   Procedure: SEPTOPLASTY;  Surgeon: JMargaretha Sheffield MD;  [place] [facility]  Service: ENT;  Laterality: Bilateral;  [place] 11/09/2018   Procedure: TURBINATE REDUCTION-inferior;  Surgeon: JMargaretha Sheffield MD;  [place] [facility]  Service: ENT;  Laterality: Bilateral;   VIDEO BRONCHOSCOPY WITH ENDOBRONCHIAL NAVIGATION N/A 12/22/2018   Procedure: VIDEO BRONCHOSCOPY WITH ENDOBRONCHIAL NAVIGATION;  Surgeon: AOttie Glazier MD;  Location: [facility];  Service: Thoracic;  Laterality: N/A;   WISDOM TOOTH EXTRACTION      FAMILY HISTORY: No family history on file.  ADVANCED DIRECTIVES (Y/N):  N  HEALTH MAINTENANCE: Social History   Tobacco Use   Smoking status: Former    Years: 14.00    Types: Cigarettes    Quit date: 2001    Years since quitting: 21.5   Smokeless tobacco: Former    Types: Chew    Quit date: 12/18/2017  Vaping Use   Vaping Use: Never used  Substance Use  Topics   Alcohol use: Not Currently    Alcohol/week: 3.0 standard drinks    Types: 3 Cans of beer per week   Drug use: Never     Colonoscopy:  PAP:  Bone density:  Lipid panel:  No Known Allergies  Current Outpatient  Medications  Medication Sig Dispense Refill   Fluticasone Propionate (XHANCE) 93 MCG/ACT EXHU Place 1 spray into both nostrils 2 (two) times daily.     Multiple Vitamin (MULTIVITAMIN WITH MINERALS) TABS tablet Take 1 tablet by mouth daily.     acetaminophen (TYLENOL) 325 MG tablet Take 650 mg by mouth every 6 (six) hours as needed. (Patient not taking: No sig reported)     acyclovir (ZOVIRAX) 200 MG capsule Take by mouth. (Patient not taking: No sig reported)     acyclovir (ZOVIRAX) 200 MG capsule TAKE 1 CAPSULE BY MOUTH 5 TIMES DAILY (Patient not taking: Reported on 09/02/2020) 50 capsule 1   ibuprofen (ADVIL) 600 MG tablet Take by mouth. (Patient not taking: No sig reported)     predniSONE (DELTASONE) 20 MG tablet TAKE 1 TABLET (20 MG TOTAL) BY MOUTH ONCE DAILY (Patient not taking: No sig reported) 5 tablet 0   predniSONE (DELTASONE) 5 MG tablet Take by mouth. (Patient not taking: No sig reported)     triamcinolone cream (KENALOG) 0.1 % APPLY TO THE AFFECTED AREA(S) TWICE DAILY FOR FLARE/ITCH (Patient not taking: No sig reported)     No current facility-administered medications for this visit.    OBJECTIVE: Vitals:   09/02/20 0909  BP: 127/81  Pulse: 72  Resp: 18  Temp: (!) 97.5 F (36.4 C)  SpO2: 100%     Body mass index is 27.84 kg/m.    ECOG FS:0 - Asymptomatic  General: Well-developed, well-nourished, no acute distress. Eyes: Pink conjunctiva, anicteric sclera. HEENT: Normocephalic, moist mucous membranes. Lungs: No audible wheezing or coughing. Heart: Regular rate and rhythm. Abdomen: Soft, nontender, no obvious distention. Musculoskeletal: No edema, cyanosis, or clubbing. Neuro: Alert, answering all questions appropriately. Cranial nerves grossly intact. Skin: No rashes or petechiae noted. Psych: Normal affect.  LAB RESULTS:  No results found for: NA, K, CL, CO2, GLUCOSE, BUN, CREATININE, CALCIUM, PROT, ALBUMIN, AST, ALT, ALKPHOS, BILITOT, GFRNONAA, GFRAA  Lab Results   Component Value Date   WBC 10.2 12/19/2018   HGB 12.4 (L) 12/19/2018   HCT 39.5 12/19/2018   MCV 92.7 12/19/2018   PLT 400 12/19/2018     STUDIES: No results found.  ASSESSMENT: Wegener's granulomatosis.   PLAN:   1. Wegener's granulomatosis: Proceed with 1000 mg IV Ruxience (bio similar to Rituxan) today.  Patient expressed understanding that all laboratory work and any symptoms related to his Wegener's will be monitored and evaluated per rheumatology.  Case previously discussed with Dr. Jefm Bryant.  Patient no longer requires his day 15 infusion.  Return to clinic in 6 months for further evaluation and continuation of treatment if needed.  I spent a total of 30 minutes reviewing chart data, face-to-face evaluation with the patient, counseling and coordination of care as detailed above.    Patient expressed understanding and was in agreement with this plan. He also understands that He can call clinic at any time with any questions, concerns, or complaints.    Lloyd Huger, MD   09/03/2020 7:38 AM

## 2020-09-02 ENCOUNTER — Inpatient Hospital Stay: Payer: 59

## 2020-09-02 ENCOUNTER — Inpatient Hospital Stay: Payer: 59 | Attending: Oncology | Admitting: Oncology

## 2020-09-02 VITALS — BP 110/76 | HR 73 | Resp 20

## 2020-09-02 VITALS — BP 127/81 | HR 72 | Temp 97.5°F | Resp 18 | Wt 194.0 lb

## 2020-09-02 DIAGNOSIS — Z5112 Encounter for antineoplastic immunotherapy: Secondary | ICD-10-CM | POA: Insufficient documentation

## 2020-09-02 DIAGNOSIS — Z79899 Other long term (current) drug therapy: Secondary | ICD-10-CM | POA: Insufficient documentation

## 2020-09-02 DIAGNOSIS — M3130 Wegener's granulomatosis without renal involvement: Secondary | ICD-10-CM

## 2020-09-02 MED ORDER — METHYLPREDNISOLONE SODIUM SUCC 125 MG IJ SOLR
100.0000 mg | Freq: Once | INTRAMUSCULAR | Status: AC
  Administered 2020-09-02: 100 mg via INTRAVENOUS
  Filled 2020-09-02: qty 2

## 2020-09-02 MED ORDER — ACETAMINOPHEN 325 MG PO TABS
650.0000 mg | ORAL_TABLET | Freq: Once | ORAL | Status: AC
  Administered 2020-09-02: 650 mg via ORAL
  Filled 2020-09-02: qty 2

## 2020-09-02 MED ORDER — DIPHENHYDRAMINE HCL 25 MG PO CAPS
50.0000 mg | ORAL_CAPSULE | Freq: Once | ORAL | Status: AC
  Administered 2020-09-02: 50 mg via ORAL
  Filled 2020-09-02: qty 2

## 2020-09-02 MED ORDER — SODIUM CHLORIDE 0.9 % IV SOLN
Freq: Once | INTRAVENOUS | Status: AC
  Filled 2020-09-02: qty 250

## 2020-09-02 MED ORDER — SODIUM CHLORIDE 0.9 % IV SOLN
1000.0000 mg | Freq: Once | INTRAVENOUS | Status: AC
  Administered 2020-09-02: 1000 mg via INTRAVENOUS
  Filled 2020-09-02: qty 100

## 2020-09-02 MED ORDER — DIPHENHYDRAMINE HCL 50 MG/ML IJ SOLN: 50.0000 mg | Freq: Once | INTRAMUSCULAR | Status: DC

## 2020-09-02 NOTE — Progress Notes (Signed)
Mild reaction with first infusion. Run as slow infusion. Ins not approved. Proceed per Maia Petties

## 2020-09-02 NOTE — Patient Instructions (Signed)
[facility]  Discharge Instructions: Thank you for choosing [facility] to provide your oncology and hematology care.  If you have a lab appointment with the [facility], please go directly to the [facility] and check in at the registration area.  Wear comfortable clothing and clothing appropriate for easy access to any Portacath or PICC line.   We strive to give you quality time with your provider. You may need to reschedule your appointment if you arrive late (15 or more minutes).  Arriving late affects you and other patients whose appointments are after yours.  Also, if you miss three or more appointments without notifying the office, you may be dismissed from the clinic at the provider's discretion.      For prescription refill requests, have your pharmacy contact our office and allow 72 hours for refills to be completed.    Today you received the following chemotherapy and/or immunotherapy agents: Ruxience      To help prevent nausea and vomiting after your treatment, we encourage you to take your nausea medication as directed.  BELOW ARE SYMPTOMS THAT SHOULD BE REPORTED IMMEDIATELY: *FEVER GREATER THAN 100.4 F (38 C) OR HIGHER *CHILLS OR SWEATING *NAUSEA AND VOMITING THAT IS NOT CONTROLLED WITH YOUR NAUSEA MEDICATION *UNUSUAL SHORTNESS OF BREATH *UNUSUAL BRUISING OR BLEEDING *URINARY PROBLEMS (pain or burning when urinating, or frequent urination) *BOWEL PROBLEMS (unusual diarrhea, constipation, pain near the anus) TENDERNESS IN MOUTH AND THROAT WITH OR WITHOUT PRESENCE OF ULCERS (sore throat, sores in mouth, or a toothache) UNUSUAL RASH, SWELLING OR PAIN  UNUSUAL VAGINAL DISCHARGE OR ITCHING   Items with * indicate a potential emergency and should be followed up as soon as possible or go to the Emergency Department if any problems should occur.  Please show the CHEMOTHERAPY ALERT CARD or IMMUNOTHERAPY ALERT CARD at check-in  to the Emergency Department and triage nurse.  Should you have questions after your visit or need to cancel or reschedule your appointment, please contact [facility]  [phone] and follow the prompts.  Office hours are 8:00 a.m. to 4:30 p.m. Monday - Friday. Please note that voicemails left after 4:00 p.m. may not be returned until the following business day.  We are closed weekends and major holidays. You have access to a nurse at all times for urgent questions. Please call the main number to the clinic [phone] and follow the prompts.  For any non-urgent questions, you may also contact your provider using MyChart. We now offer e-Visits for anyone 39 and older to request care online for non-urgent symptoms. For details visit [email or web address].   Also download the MyChart app! Go to the app store, search "MyChart", open the app, select [facility], and log in with your MyChart username and password.  Due to Covid, a mask is required upon entering the hospital/clinic. If you do not have a mask, one will be given to you upon arrival. For doctor visits, patients may have 1 support person aged 39 or older with them. For treatment visits, patients cannot have anyone with them due to current Covid guidelines and our immunocompromised population. Rituximab Injection What is this medication? RITUXIMAB (ri TUX i mab) is a monoclonal antibody. It is used to treat certain types of cancer like non-Hodgkin lymphoma and chronic lymphocytic leukemia. It is also used to treat rheumatoid arthritis, granulomatosis with polyangiitis,microscopic polyangiitis, and pemphigus vulgaris. This medicine may be used for other purposes; ask your health  care provider orpharmacist if you have questions. COMMON BRAND NAME(S): RIABNI, Rituxan, RUXIENCE What should I tell my care team before I take this medication? They need to know if you have any of these conditions: chest  pain heart disease infection especially a viral infection such as chickenpox, cold sores, hepatitis B, or herpes immune system problems irregular heartbeat or rhythm kidney disease low blood counts (white cells, platelets, or red cells) lung disease recent or upcoming vaccine an unusual or allergic reaction to rituximab, other medicines, foods, dyes, or preservatives pregnant or trying to get pregnant breast-feeding How should I use this medication? This medicine is injected into a vein. It is given by a health care provider ina hospital or clinic setting. A special MedGuide will be given to you before each treatment. Be sure to readthis information carefully each time. Talk to your health care provider about the use of this medicine in children. While this drug may be prescribed for children as young as 6 months forselected conditions, precautions do apply. Overdosage: If you think you have taken too much of this medicine contact apoison control center or emergency room at once. NOTE: This medicine is only for you. Do not share this medicine with others. What if I miss a dose? Keep appointments for follow-up doses. It is important not to miss your dose.Call your health care provider if you are unable to keep an appointment. What may interact with this medication? Do not take this medicine with any of the following medicines: live vaccines This medicine may also interact with the following medicines: cisplatin This list may not describe all possible interactions. Give your health care provider a list of all the medicines, herbs, non-prescription drugs, or dietary supplements you use. Also tell them if you smoke, drink alcohol, or use illegaldrugs. Some items may interact with your medicine. What should I watch for while using this medication? Your condition will be monitored carefully while you are receiving thismedicine. You may need blood work done while you are taking this  medicine. This medicine can cause serious infusion reactions. To reduce the risk your health care provider may give you other medicines to take before receiving thisone. Be sure to follow the directions from your health care provider. This medicine may increase your risk of getting an infection. Call your health care provider for advice if you get a fever, chills, sore throat, or other symptoms of a cold or flu. Do not treat yourself. Try to avoid being aroundpeople who are sick. Call your health care provider if you are around anyone with measles,chickenpox, or if you develop sores or blisters that do not heal properly. Avoid taking medicines that contain aspirin, acetaminophen, ibuprofen, naproxen, or ketoprofen unless instructed by your health care provider. Thesemedicines may hide a fever. This medicine may cause serious skin reactions. They can happen weeks to months after starting the medicine. Contact your health care provider right away if you notice fevers or flu-like symptoms with a rash. The rash may be red or purple and then turn into blisters or peeling of the skin. Or, you might notice a red rash with swelling of the face, lips or lymph nodes in your neck or underyour arms. In some patients, this medicine may cause a serious brain infection that may cause death. If you have any problems seeing, thinking, speaking, walking, or standing, tell your healthcare professional right away. If you cannot reachyour healthcare professional, urgently seek other source of medical care. Do not become pregnant while  taking this medicine or for at least 12 months after stopping it. Women should inform their health care provider if they wish to become pregnant or think they might be pregnant. There is potential for serious harm to an unborn child. Talk to your health care provider for more information. Women should use a reliable form of birth control while taking this medicine and for 12 months after stopping  it. Do not breast-feed whiletaking this medicine or for at least 6 months after stopping it. What side effects may I notice from receiving this medication? Side effects that you should report to your health care provider as soon aspossible: allergic reactions (skin rash, itching or hives; swelling of the face, lips, or tongue) diarrhea edema (sudden weight gain; swelling of the ankles, feet, hands or other unusual swelling; trouble breathing) fast, irregular heartbeat heart attack (trouble breathing; pain or tightness in the chest, neck, back or arms; unusually weak or tired) infection (fever, chills, cough, sore throat, pain or trouble passing urine) kidney injury (trouble passing urine or change in the amount of urine) liver injury (dark yellow or brown urine; general ill feeling or flu-like symptoms; loss of appetite, right upper belly pain; unusually weak or tired, yellowing of the eyes or skin) low blood pressure (dizziness; feeling faint or lightheaded, falls; unusually weak or tired) low red blood cell counts (trouble breathing; feeling faint; lightheaded, falls; unusually weak or tired) mouth sores redness, blistering, peeling, or loosening of the skin, including inside the mouth stomach pain unusual bruising or bleeding wheezing (trouble breathing with loud or whistling sounds) vomiting Side effects that usually do not require medical attention (report to yourhealth care provider if they continue or are bothersome): headache joint pain muscle cramps, pain nausea This list may not describe all possible side effects. Call your doctor for medical advice about side effects. You may report side effects to FDA at1-800-FDA-1088. Where should I keep my medication? This medicine is given in a hospital or clinic. It will not be stored at home. NOTE: This sheet is a summary. It may not cover all possible information. If you have questions about this medicine, talk to your doctor, pharmacist,  orhealth care provider.  2022 Elsevier/Gold Standard (2020-01-17 15:47:26)

## 2020-09-03 ENCOUNTER — Encounter: Payer: Self-pay | Admitting: Oncology

## 2020-09-15 ENCOUNTER — Encounter: Payer: Self-pay | Admitting: Gastroenterology

## 2020-09-16 ENCOUNTER — Ambulatory Visit: Payer: 59 | Admitting: Certified Registered"

## 2020-09-16 ENCOUNTER — Other Ambulatory Visit: Payer: Self-pay

## 2020-09-16 ENCOUNTER — Encounter
Admission: RE | Disposition: A | Discharge status: Home / Self Care | Payer: Self-pay | Source: Ambulatory Visit | Attending: Gastroenterology

## 2020-09-16 ENCOUNTER — Ambulatory Visit
Admission: RE | Admit: 2020-09-16 | Discharge: 2020-09-16 | Disposition: A | Discharge status: Home / Self Care | Payer: 59 | Source: Ambulatory Visit | Attending: Gastroenterology | Admitting: Gastroenterology

## 2020-09-16 ENCOUNTER — Encounter: Payer: Self-pay | Admitting: Gastroenterology

## 2020-09-16 DIAGNOSIS — D128 Benign neoplasm of rectum: Secondary | ICD-10-CM | POA: Diagnosis not present

## 2020-09-16 DIAGNOSIS — D125 Benign neoplasm of sigmoid colon: Secondary | ICD-10-CM | POA: Diagnosis not present

## 2020-09-16 DIAGNOSIS — Z791 Long term (current) use of non-steroidal anti-inflammatories (NSAID): Secondary | ICD-10-CM | POA: Insufficient documentation

## 2020-09-16 DIAGNOSIS — Z1211 Encounter for screening for malignant neoplasm of colon: Secondary | ICD-10-CM | POA: Diagnosis not present

## 2020-09-16 DIAGNOSIS — M3130 Wegener's granulomatosis without renal involvement: Secondary | ICD-10-CM | POA: Diagnosis not present

## 2020-09-16 DIAGNOSIS — Z87891 Personal history of nicotine dependence: Secondary | ICD-10-CM | POA: Insufficient documentation

## 2020-09-16 DIAGNOSIS — Z7952 Long term (current) use of systemic steroids: Secondary | ICD-10-CM | POA: Insufficient documentation

## 2020-09-16 DIAGNOSIS — K621 Rectal polyp: Secondary | ICD-10-CM | POA: Diagnosis not present

## 2020-09-16 DIAGNOSIS — K635 Polyp of colon: Secondary | ICD-10-CM

## 2020-09-16 DIAGNOSIS — C7A026 Malignant carcinoid tumor of the rectum: Secondary | ICD-10-CM | POA: Insufficient documentation

## 2020-09-16 DIAGNOSIS — D123 Benign neoplasm of transverse colon: Secondary | ICD-10-CM | POA: Diagnosis not present

## 2020-09-16 HISTORY — PX: COLONOSCOPY WITH PROPOFOL: SHX5780

## 2020-09-16 SURGERY — COLONOSCOPY WITH PROPOFOL
Anesthesia: General

## 2020-09-16 MED ORDER — PROPOFOL 500 MG/50ML IV EMUL
INTRAVENOUS | Status: DC | PRN
  Administered 2020-09-16: 120 ug/kg/min via INTRAVENOUS

## 2020-09-16 MED ORDER — SODIUM CHLORIDE 0.9 % IV SOLN: INTRAVENOUS | Status: DC

## 2020-09-16 MED ORDER — PROPOFOL 10 MG/ML IV BOLUS
INTRAVENOUS | Status: DC | PRN
  Administered 2020-09-16: 100 mg via INTRAVENOUS
  Administered 2020-09-16: 50 mg via INTRAVENOUS

## 2020-09-16 MED ORDER — LIDOCAINE 2% (20 MG/ML) 5 ML SYRINGE
INTRAMUSCULAR | Status: DC | PRN
  Administered 2020-09-16: 20 mg via INTRAVENOUS

## 2020-09-16 MED ORDER — MIDAZOLAM HCL 5 MG/5ML IJ SOLN
INTRAMUSCULAR | Status: DC | PRN
  Administered 2020-09-16: 2 mg via INTRAVENOUS

## 2020-09-16 NOTE — Anesthesia Preprocedure Evaluation (Signed)
Anesthesia Evaluation  Patient identified by MRN, date of birth, ID band Patient awake    Reviewed: Allergy & Precautions, NPO status   History of Anesthesia Complications Negative for: history of anesthetic complications  Airway Mallampati: I  TM Distance: >3 FB Neck ROM: Full    Dental no notable dental hx. (+) Teeth Intact   Pulmonary former smoker,    Pulmonary exam normal        Cardiovascular Exercise Tolerance: Good Normal cardiovascular examI     Neuro/Psych    GI/Hepatic   Endo/Other    Renal/GU      Musculoskeletal   Abdominal Normal abdominal exam  (+)   Peds  Hematology   Anesthesia Other Findings   Reproductive/Obstetrics                             Anesthesia Physical Anesthesia Plan  ASA: 1  Anesthesia Plan: General   Post-op Pain Management:    Induction:   PONV Risk Score and Plan:   Airway Management Planned: Simple Face Mask  Additional Equipment: None  Intra-op Plan:   Post-operative Plan:   Informed Consent: I have reviewed the patients History and Physical, chart, labs and discussed the procedure including the risks, benefits and alternatives for the proposed anesthesia with the patient or authorized representative who has indicated his/her understanding and acceptance.       Plan Discussed with: CRNA  Anesthesia Plan Comments:         Anesthesia Quick Evaluation

## 2020-09-16 NOTE — Transfer of Care (Signed)
Immediate Anesthesia Transfer of Care Note  Patient: [name]  Procedure(s) Performed: COLONOSCOPY WITH PROPOFOL  Patient Location: [facility] Anesthesia Type:General  Level of Consciousness: drowsy  Airway & Oxygen Therapy: Patient Spontanous Breathing  Post-op Assessment: Report given to RN and Post -op Vital signs reviewed and stable  Post vital signs: Reviewed  Last Vitals:  Vitals Value Taken Time  BP 93/71 09/16/20 0913  Temp    Pulse 67 09/16/20 0913  Resp 5 09/16/20 0912  SpO2 98 % 09/16/20 0913  Vitals shown include unvalidated device data.  Last Pain:  Vitals:   09/16/20 0828  TempSrc: Temporal  PainSc: 0-No pain         Complications: No notable events documented.

## 2020-09-16 NOTE — Op Note (Signed)
[facility] Gastroenterology Patient Name: Thomas Gibbs Procedure Date: 09/16/2020 8:49 AM MRN: [id number] Account #: [id number] Date of Birth: [birth year] Admit Type: Outpatient Age: 50 Room: [facility] ENDO ROOM 4 Gender: Male Note Status: Finalized Procedure:             Colonoscopy Indications:           Screening for colorectal malignant neoplasm Providers:             Lucilla Lame MD, MD Medicines:             Propofol per Anesthesia Complications:         No immediate complications. Procedure:             Pre-Anesthesia Assessment:                        - Prior to the procedure, a History and Physical was                         performed, and patient medications and allergies were                         reviewed. The patient's tolerance of previous                         anesthesia was also reviewed. The risks and benefits                         of the procedure and the sedation options and risks                         were discussed with the patient. All questions were                         answered, and informed consent was obtained. Prior                         Anticoagulants: The patient has taken no previous                         anticoagulant or antiplatelet agents. ASA Grade                         Assessment: II - A patient with mild systemic disease.                         After reviewing the risks and benefits, the patient                         was deemed in satisfactory condition to undergo the                         procedure.                        After obtaining informed consent, the colonoscope was                         passed under direct vision. Throughout the procedure,  the patient's blood pressure, pulse, and oxygen                         saturations were monitored continuously. The                         Colonoscope was introduced through the anus and                         advanced to the the  cecum, identified by appendiceal                         orifice and ileocecal valve. The colonoscopy was                         performed without difficulty. The patient tolerated                         the procedure well. The quality of the bowel                         preparation was excellent. Findings:      The perianal and digital rectal examinations were normal.      A 4 mm polyp was found in the transverse colon. The polyp was sessile.       The polyp was removed with a cold snare. Resection and retrieval were       complete.      Four sessile polyps were found in the sigmoid colon. The polyps were 3       to 5 mm in size. These polyps were removed with a cold snare. Resection       and retrieval were complete.      Two sessile polyps were found in the rectum. The polyps were 3 to 4 mm       in size. These polyps were removed with a cold snare. Resection and       retrieval were complete. Impression:            - One 4 mm polyp in the transverse colon, removed with                         a cold snare. Resected and retrieved.                        - Four 3 to 5 mm polyps in the sigmoid colon, removed                         with a cold snare. Resected and retrieved.                        - Two 3 to 4 mm polyps in the rectum, removed with a                         cold snare. Resected and retrieved. Recommendation:        - Discharge patient to home.                        - Resume previous diet.                        -  Continue present medications.                        - Await pathology results.                        - Repeat colonoscopy in 3 years for surveillance if                         adenomatous otherwise 10 years. Procedure Code(s):     --- Professional ---                        [phone], Colonoscopy, flexible; with removal of                         tumor(s), polyp(s), or other lesion(s) by snare                         technique Diagnosis Code(s):     ---  Professional ---                        Z12.11, Encounter for screening for malignant neoplasm                         of colon                        K63.5, Polyp of colon CPT copyright 2019 American Medical Association. All rights reserved. The codes documented in this report are preliminary and upon coder review may  be revised to meet current compliance requirements. Lucilla Lame MD, MD 09/16/2020 9:11:53 AM This report has been signed electronically. Number of Addenda: 0 Note Initiated On: 09/16/2020 8:49 AM Scope Withdrawal Time: 0 hours 11 minutes 29 seconds  Total Procedure Duration: 0 hours 14 minutes 4 seconds  Estimated Blood Loss:  Estimated blood loss: none.      [facility]

## 2020-09-16 NOTE — Anesthesia Postprocedure Evaluation (Signed)
Anesthesia Post Note  Patient: Thomas Gibbs  Procedure(s) Performed: COLONOSCOPY WITH PROPOFOL  Patient location during evaluation: PACU Anesthesia Type: General Level of consciousness: awake and alert Pain management: pain level controlled Vital Signs Assessment: post-procedure vital signs reviewed and stable Respiratory status: spontaneous breathing, nonlabored ventilation, respiratory function stable and patient connected to nasal cannula oxygen Cardiovascular status: blood pressure returned to baseline and stable Postop Assessment: no apparent nausea or vomiting Anesthetic complications: no   No notable events documented.   Last Vitals:  Vitals:   09/16/20 0828 09/16/20 0910  BP: 114/88 93/71  Pulse: 67 67  Resp: 18 (!) 8  Temp: (!) 35.9 C (!) 35.9 C  SpO2: 98% 97%    Last Pain:  Vitals:   09/16/20 0910  TempSrc: Temporal  PainSc:                  Lennox Pippins

## 2020-09-16 NOTE — H&P (Signed)
Lucilla Lame, MD [address] [place], [place] [address] Phone: [phone] Fax : [phone]  Primary Care Physician:  Rusty Aus, MD Primary Gastroenterologist:  Dr. Allen Norris  Pre-Procedure History & Physical: HPI:  Thomas Gibbs is a 50 y.o. male is here for a screening colonoscopy.   Past Medical History:  Diagnosis Date   BMI 28.0-28.9,adult    Dysrhythmia    tachycardia    Headache    sinus   Wegener's granulomatosis (granulomatosis with polyangiitis)     Past Surgical History:  Procedure Laterality Date   ETHMOIDECTOMY Bilateral 11/09/2018   Procedure: Total ETHMOIDECTOMY with sphenoidotomy-Left Total Ethmoidectomy with Sphenoidectomy (Tissue Removal)-Right;  Surgeon: Margaretha Sheffield, MD;  Location: [facility];  Service: ENT;  Laterality: Bilateral;   FRONTAL SINUS EXPLORATION Bilateral 11/09/2018   Procedure: FRONTAL SINUSotomy;  Surgeon: Margaretha Sheffield, MD;  Location: [facility];  Service: ENT;  Laterality: Bilateral;   IMAGE GUIDED SINUS SURGERY Bilateral 11/09/2018   Procedure: IMAGE GUIDED SINUS SURGERY;  Surgeon: Margaretha Sheffield, MD;  Location: [facility];  Service: ENT;  Laterality: Bilateral;  NEED STRYKER DISK   LUNG BIOPSY     MAXILLARY ANTROSTOMY Bilateral 11/09/2018   Procedure: MAXILLARY ANTROSTOMY with removal of tissue;  Surgeon: Margaretha Sheffield, MD;  Location: [facility];  Service: ENT;  Laterality: Bilateral;   SEPTOPLASTY Bilateral 11/09/2018   Procedure: SEPTOPLASTY;  Surgeon: Margaretha Sheffield, MD;  Location: [facility];  Service: ENT;  Laterality: Bilateral;  Gave disk to [facility] Bilateral 11/09/2018   Procedure: TURBINATE REDUCTION-inferior;  Surgeon: Margaretha Sheffield, MD;  Location: [facility];  Service: ENT;  Laterality: Bilateral;   VIDEO BRONCHOSCOPY WITH ENDOBRONCHIAL NAVIGATION N/A 12/22/2018   Procedure: VIDEO BRONCHOSCOPY WITH ENDOBRONCHIAL NAVIGATION;   Surgeon: Ottie Glazier, MD;  Location: [facility];  Service: Thoracic;  Laterality: N/A;   WISDOM TOOTH EXTRACTION      Prior to Admission medications   Medication Sig Start Date End Date Taking? Authorizing Provider  Fluticasone Propionate (XHANCE) 93 MCG/ACT EXHU Place 1 spray into both nostrils 2 (two) times daily.   Yes [provider]  acetaminophen (TYLENOL) 325 MG tablet Take 650 mg by mouth every 6 (six) hours as needed. Patient not taking: No sig reported    [provider]  acyclovir (ZOVIRAX) 200 MG capsule Take by mouth. Patient not taking: No sig reported 01/16/19   [provider]  acyclovir (ZOVIRAX) 200 MG capsule TAKE 1 CAPSULE BY MOUTH 5 TIMES DAILY Patient not taking: Reported on 09/02/2020 02/12/20 02/11/21  Rusty Aus, MD  ibuprofen (ADVIL) 600 MG tablet Take by mouth. Patient not taking: No sig reported    [provider]  Multiple Vitamin (MULTIVITAMIN WITH MINERALS) TABS tablet Take 1 tablet by mouth daily.    [provider]  predniSONE (DELTASONE) 20 MG tablet TAKE 1 TABLET (20 MG TOTAL) BY MOUTH ONCE DAILY Patient not taking: No sig reported 12/07/19 12/06/20  Ottie Glazier, MD  predniSONE (DELTASONE) 5 MG tablet Take by mouth. Patient not taking: No sig reported    [provider]  triamcinolone cream (KENALOG) 0.1 % APPLY TO THE AFFECTED AREA(S) TWICE DAILY FOR FLARE/ITCH Patient not taking: No sig reported 05/10/19   [provider]    Allergies as of 08/04/2020   (No Known Allergies)    History reviewed. No pertinent family history.  Social History   Socioeconomic History   Marital status: Married  Spouse name: Not on file   Number of children: Not on file   Years of education: Not on file   Highest education level: Not on file  Occupational History   Not on file  Tobacco Use   Smoking status: Former    Years: 14.00    Types: Cigarettes    Quit date: 2001    Years since quitting:  21.6   Smokeless tobacco: Former    Types: Chew    Quit date: 12/18/2017  Vaping Use   Vaping Use: Never used  Substance and Sexual Activity   Alcohol use: Not Currently    Alcohol/week: 3.0 standard drinks    Types: 3 Cans of beer per week   Drug use: Never   Sexual activity: Not on file  Other Topics Concern   Not on file  Social History Narrative   Not on file   Social Determinants of Health   Financial Resource Strain: Not on file  Food Insecurity: Not on file  Transportation Needs: Not on file  Physical Activity: Not on file  Stress: Not on file  Social Connections: Not on file  Intimate Partner Violence: Not on file    Review of Systems: See HPI, otherwise negative ROS  Physical Exam: BP 114/88   Pulse 67   Temp (!) 96.7 F (35.9 C) (Temporal)   Resp 18   Ht '5\' 10"'$  (1.778 m)   Wt 88 kg   SpO2 98%   BMI 27.84 kg/m  General:   Alert,  pleasant and cooperative in NAD Head:  Normocephalic and atraumatic. Neck:  Supple; no masses or thyromegaly. Lungs:  Clear throughout to auscultation.    Heart:  Regular rate and rhythm. Abdomen:  Soft, nontender and nondistended. Normal bowel sounds, without guarding, and without rebound.   Neurologic:  Alert and  oriented x4;  grossly normal neurologically.  Impression/Plan: Thomas Gibbs is now here to undergo a screening colonoscopy.  Risks, benefits, and alternatives regarding colonoscopy have been reviewed with the patient.  Questions have been answered.  All parties agreeable.

## 2020-09-17 ENCOUNTER — Encounter: Payer: Self-pay | Admitting: Gastroenterology

## 2020-09-18 LAB — SURGICAL PATHOLOGY

## 2020-09-19 ENCOUNTER — Telehealth: Payer: Self-pay

## 2020-09-23 ENCOUNTER — Other Ambulatory Visit: Payer: Self-pay

## 2020-09-23 ENCOUNTER — Encounter: Payer: Self-pay | Admitting: Gastroenterology

## 2020-09-23 ENCOUNTER — Ambulatory Visit (INDEPENDENT_AMBULATORY_CARE_PROVIDER_SITE_OTHER): Payer: 59 | Admitting: Gastroenterology

## 2020-09-23 VITALS — BP 113/69 | HR 67 | Ht 70.0 in | Wt 194.6 lb

## 2020-09-23 DIAGNOSIS — K621 Rectal polyp: Secondary | ICD-10-CM | POA: Diagnosis not present

## 2020-09-23 NOTE — Progress Notes (Signed)
Primary Care Physician: Thomas Aus, MD  Primary Gastroenterologist:  Dr. Lucilla Gibbs  Chief Complaint  Patient presents with   Follow up procedure results    HPI: Thomas Gibbs is a 50 y.o. male here Here for follow-up after having colonoscopy with multiple polyps found.  The patient had some of the polyps that were tubular adenomas and a finding of 4 polyps that were hyperplastic.  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  - TUMOR INVOLVES THE MUSCULARIS MUCOSAE AND EXTENDS TO THE DEEP EDGE OF THE TISSUE.   The patient now comes in for follow-up to review the pathology results.  Past Medical History:  Diagnosis Date   BMI 28.0-28.9,adult    Dysrhythmia    tachycardia    Headache    sinus   Wegener's granulomatosis (granulomatosis with polyangiitis)     Current Outpatient Medications  Medication Sig Dispense Refill   acetaminophen (TYLENOL) 325 MG tablet Take 650 mg by mouth every 6 (six) hours as needed. (Patient not taking: No sig reported)     acyclovir (ZOVIRAX) 200 MG capsule Take by mouth. (Patient not taking: No sig reported)     acyclovir (ZOVIRAX) 200 MG capsule TAKE 1 CAPSULE BY MOUTH 5 TIMES DAILY (Patient not taking: No sig reported) 50 capsule 1   Fluticasone Propionate (XHANCE) 93 MCG/ACT EXHU Place 1 spray into both nostrils 2 (two) times daily.     ibuprofen (ADVIL) 600 MG tablet Take by mouth. (Patient not taking: No sig reported)     Multiple Vitamin (MULTIVITAMIN WITH MINERALS) TABS tablet Take 1 tablet by mouth daily.     predniSONE (DELTASONE) 20 MG tablet TAKE 1 TABLET (20 MG TOTAL) BY MOUTH ONCE DAILY (Patient not taking: No sig reported) 5 tablet 0   predniSONE (DELTASONE) 5 MG tablet Take by mouth. (Patient not taking: No sig reported)     riTUXimab in sodium chloride 0.9 % 250 mL Inject into the vein once.     triamcinolone  cream (KENALOG) 0.1 % APPLY TO THE AFFECTED AREA(S) TWICE DAILY FOR FLARE/ITCH (Patient not taking: No sig reported)     No current facility-administered medications for this visit.    Allergies as of 09/23/2020   (No Known Allergies)    ROS:  General: Negative for anorexia, weight loss, fever, chills, fatigue, weakness. ENT: Negative for hoarseness, difficulty swallowing , nasal congestion. CV: Negative for chest pain, angina, palpitations, dyspnea on exertion, peripheral edema.  Respiratory: Negative for dyspnea at rest, dyspnea on exertion, cough, sputum, wheezing.  GI: See history of present illness. GU:  Negative for dysuria, hematuria, urinary incontinence, urinary frequency, nocturnal urination.  Endo: Negative for unusual weight change.    Physical Examination:   BP 113/69 (BP Location: Right Arm, Patient Position: Sitting, Cuff Size: Normal)   Pulse 67   Ht '5\' 10"'$  (1.778 m)   Wt 194 lb 9.6 oz (88.3 kg)   BMI 27.92 kg/m   General: Well-nourished, well-developed in no acute distress.  Eyes: No icterus. Conjunctivae pink. Skin: Warm and dry, no jaundice.   Psych: Alert and cooperative, normal mood and affect.  Labs:    Imaging Studies: No results found.  Assessment and Plan:   Thomas Gibbs is a 50 y.o. y/o male who comes in today for follow-up after having a colonoscopy and a rectal carcinoid found.  The pathology showed  that the tumor invades the muscularis mucosa and extensive deep edge of the tissue. A message was put out to Dr. Rush Landmark in [place] to see if this is something that he can remove endoscopically.  The patient will be brought back to the endoscopy unit this weeks to have the areas tattooed in the rectum so that they could be found later.  The patient has been explained the plan and agrees with it.     Thomas Lame, MD. Marval Regal    Note: This dictation was prepared with Dragon dictation along with smaller phrase technology. Any  transcriptional errors that result from this process are unintentional.

## 2020-09-23 NOTE — H&P (View-Only) (Signed)
Primary Care Physician: Rusty Aus, MD  Primary Gastroenterologist:  Dr. Lucilla Lame  Chief Complaint  Patient presents with   Follow up procedure results    HPI: Thomas Gibbs is a 50 y.o. male here Here for follow-up after having colonoscopy with multiple polyps found.  The patient had some of the polyps that were tubular adenomas and a finding of 4 polyps that were hyperplastic.  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  - TUMOR INVOLVES THE MUSCULARIS MUCOSAE AND EXTENDS TO THE DEEP EDGE OF THE TISSUE.   The patient now comes in for follow-up to review the pathology results.  Past Medical History:  Diagnosis Date   BMI 28.0-28.9,adult    Dysrhythmia    tachycardia    Headache    sinus   Wegener's granulomatosis (granulomatosis with polyangiitis)     Current Outpatient Medications  Medication Sig Dispense Refill   acetaminophen (TYLENOL) 325 MG tablet Take 650 mg by mouth every 6 (six) hours as needed. (Patient not taking: No sig reported)     acyclovir (ZOVIRAX) 200 MG capsule Take by mouth. (Patient not taking: No sig reported)     acyclovir (ZOVIRAX) 200 MG capsule TAKE 1 CAPSULE BY MOUTH 5 TIMES DAILY (Patient not taking: No sig reported) 50 capsule 1   Fluticasone Propionate (XHANCE) 93 MCG/ACT EXHU Place 1 spray into both nostrils 2 (two) times daily.     ibuprofen (ADVIL) 600 MG tablet Take by mouth. (Patient not taking: No sig reported)     Multiple Vitamin (MULTIVITAMIN WITH MINERALS) TABS tablet Take 1 tablet by mouth daily.     predniSONE (DELTASONE) 20 MG tablet TAKE 1 TABLET (20 MG TOTAL) BY MOUTH ONCE DAILY (Patient not taking: No sig reported) 5 tablet 0   predniSONE (DELTASONE) 5 MG tablet Take by mouth. (Patient not taking: No sig reported)     riTUXimab in sodium chloride 0.9 % 250 mL Inject into the vein once.     triamcinolone  cream (KENALOG) 0.1 % APPLY TO THE AFFECTED AREA(S) TWICE DAILY FOR FLARE/ITCH (Patient not taking: No sig reported)     No current facility-administered medications for this visit.    Allergies as of 09/23/2020   (No Known Allergies)    ROS:  General: Negative for anorexia, weight loss, fever, chills, fatigue, weakness. ENT: Negative for hoarseness, difficulty swallowing , nasal congestion. CV: Negative for chest pain, angina, palpitations, dyspnea on exertion, peripheral edema.  Respiratory: Negative for dyspnea at rest, dyspnea on exertion, cough, sputum, wheezing.  GI: See history of present illness. GU:  Negative for dysuria, hematuria, urinary incontinence, urinary frequency, nocturnal urination.  Endo: Negative for unusual weight change.    Physical Examination:   BP 113/69 (BP Location: Right Arm, Patient Position: Sitting, Cuff Size: Normal)   Pulse 67   Ht '5\' 10"'$  (1.778 m)   Wt 194 lb 9.6 oz (88.3 kg)   BMI 27.92 kg/m   General: Well-nourished, well-developed in no acute distress.  Eyes: No icterus. Conjunctivae pink. Skin: Warm and dry, no jaundice.   Psych: Alert and cooperative, normal mood and affect.  Labs:    Imaging Studies: No results found.  Assessment and Plan:   Thomas Gibbs is a 50 y.o. y/o male who comes in today for follow-up after having a colonoscopy and a rectal carcinoid found.  The pathology showed  that the tumor invades the muscularis mucosa and extensive deep edge of the tissue. A message was put out to Dr. Rush Landmark in [place] to see if this is something that he can remove endoscopically.  The patient will be brought back to the endoscopy unit this weeks to have the areas tattooed in the rectum so that they could be found later.  The patient has been explained the plan and agrees with it.     Lucilla Lame, MD. Marval Regal    Note: This dictation was prepared with Dragon dictation along with smaller phrase technology. Any  transcriptional errors that result from this process are unintentional.

## 2020-09-24 ENCOUNTER — Telehealth: Payer: Self-pay

## 2020-09-24 NOTE — Telephone Encounter (Signed)
ERROR

## 2020-09-24 NOTE — Telephone Encounter (Signed)
Mansouraty, Telford Nab., MD  Lucilla Lame, MD; Timothy Lasso, RN [facility], thanks for the reply.  Ragina Fenter, please move forward with scheduling this patient a lower EUS/EMR 90-minute case, next available with me in the coming weeks for rectal carcinoid.  Patient should have a half preparation of MiraLAX and flex sig prep to ensure that the colon is clean for attempt at resection.  Imaging and labs will be performed at [facility] through [facility].  Please update [place] when the patient is scheduled.  Thanks.  GM

## 2020-09-25 ENCOUNTER — Encounter: Payer: Self-pay | Admitting: Gastroenterology

## 2020-09-25 ENCOUNTER — Ambulatory Visit
Admission: RE | Admit: 2020-09-25 | Discharge: 2020-09-25 | Disposition: A | Discharge status: Home / Self Care | Payer: 59 | Attending: Gastroenterology | Admitting: Gastroenterology

## 2020-09-25 ENCOUNTER — Encounter: Payer: Self-pay | Admitting: Anesthesiology

## 2020-09-25 ENCOUNTER — Other Ambulatory Visit: Payer: Self-pay

## 2020-09-25 ENCOUNTER — Encounter
Admission: RE | Disposition: A | Discharge status: Home / Self Care | Payer: Self-pay | Source: Home / Self Care | Attending: Gastroenterology

## 2020-09-25 DIAGNOSIS — D3A026 Benign carcinoid tumor of the rectum: Secondary | ICD-10-CM

## 2020-09-25 DIAGNOSIS — C7A026 Malignant carcinoid tumor of the rectum: Secondary | ICD-10-CM | POA: Diagnosis not present

## 2020-09-25 DIAGNOSIS — K621 Rectal polyp: Secondary | ICD-10-CM

## 2020-09-25 DIAGNOSIS — M3130 Wegener's granulomatosis without renal involvement: Secondary | ICD-10-CM | POA: Insufficient documentation

## 2020-09-25 DIAGNOSIS — Z86010 Personal history of colonic polyps: Secondary | ICD-10-CM | POA: Diagnosis not present

## 2020-09-25 DIAGNOSIS — Z7952 Long term (current) use of systemic steroids: Secondary | ICD-10-CM | POA: Diagnosis not present

## 2020-09-25 DIAGNOSIS — D374 Neoplasm of uncertain behavior of colon: Secondary | ICD-10-CM | POA: Diagnosis present

## 2020-09-25 DIAGNOSIS — K6289 Other specified diseases of anus and rectum: Secondary | ICD-10-CM

## 2020-09-25 DIAGNOSIS — Z79899 Other long term (current) drug therapy: Secondary | ICD-10-CM | POA: Insufficient documentation

## 2020-09-25 DIAGNOSIS — Z791 Long term (current) use of non-steroidal anti-inflammatories (NSAID): Secondary | ICD-10-CM | POA: Insufficient documentation

## 2020-09-25 HISTORY — PX: FLEXIBLE SIGMOIDOSCOPY: SHX5431

## 2020-09-25 SURGERY — SIGMOIDOSCOPY, FLEXIBLE

## 2020-09-25 MED ORDER — SODIUM CHLORIDE 0.9 % IV SOLN: INTRAVENOUS | Status: DC

## 2020-09-25 NOTE — Op Note (Signed)
[facility] Patient Name: Thomas Gibbs Procedure Date: 09/25/2020 11:18 AM MRN: [id number] Account #: [id number] Date of Birth: 02/20/1970 Admit Type: Outpatient Age: 50 Room: [facility] ENDO ROOM 4 Gender: Male Note Status: Finalized Procedure:             Flexible Sigmoidoscopy Indications:           Colon polyps of uncertain behavior Providers:             Lucilla Lame MD, MD Referring MD:          Rusty Aus, MD (Referring MD) Medicines:             None Complications:         No immediate complications. Procedure:             Pre-Anesthesia Assessment:                        - Prior to the procedure, a History and Physical was                         performed, and patient medications and allergies were                         reviewed. The patient's tolerance of previous                         anesthesia was also reviewed. The risks and benefits                         of the procedure and the sedation options and risks                         were discussed with the patient. All questions were                         answered, and informed consent was obtained. Prior                         Anticoagulants: The patient has taken no previous                         anticoagulant or antiplatelet agents. ASA Grade                         Assessment: II - A patient with mild systemic disease.                         After reviewing the risks and benefits, the patient                         was deemed in satisfactory condition to undergo the                         procedure.                        After obtaining informed consent, the scope was passed  under direct vision. The Endoscope was introduced                         through the anus and advanced to the the rectum. The                         flexible sigmoidoscopy was accomplished without                         difficulty. The patient tolerated the procedure  well.                         The quality of the bowel preparation was excellent. Findings:      The perianal and digital rectal examinations were normal.      A single (solitary) ulcer was found in the rectum. No bleeding was       present. No stigmata of recent bleeding were seen. Area was successfully       injected with 3 mL Spot (carbon black) for tattooing. Impression:            - No specimens collected. Recommendation:        - Discharge patient to home. Procedure Code(s):     --- Professional ---                        [phone], 44, Sigmoidoscopy, flexible; with directed                         submucosal injection(s), any substance Diagnosis Code(s):     --- Professional ---                        D37.4, Neoplasm of uncertain behavior of colon CPT copyright 2019 American Medical Association. All rights reserved. The codes documented in this report are preliminary and upon coder review may  be revised to meet current compliance requirements. Lucilla Lame MD, MD 09/25/2020 11:29:41 AM This report has been signed electronically. Number of Addenda: 0 Note Initiated On: 09/25/2020 11:18 AM Total Procedure Duration: 0 hours 3 minutes 27 seconds  Estimated Blood Loss:  Estimated blood loss: none. Estimated blood loss: none.      [facility]

## 2020-09-25 NOTE — Interval H&P Note (Signed)
Lucilla Lame, MD [address] [place], [place] [address] Phone:[phone] Fax : [phone]  Primary Care Physician:  Rusty Aus, MD Primary Gastroenterologist:  Dr. Allen Norris  Pre-Procedure History & Physical: HPI:  Thomas Gibbs is a 50 y.o. male is here for an flexible sigmoidoscopy.   Past Medical History:  Diagnosis Date   BMI 28.0-28.9,adult    Dysrhythmia    tachycardia    Headache    sinus   Wegener's granulomatosis (granulomatosis with polyangiitis)     Past Surgical History:  Procedure Laterality Date   COLONOSCOPY WITH PROPOFOL N/A 09/16/2020   Procedure: COLONOSCOPY WITH PROPOFOL;  Surgeon: Lucilla Lame, MD;  Location: [facility] ENDOSCOPY;  Service: Endoscopy;  Laterality: N/A;   ETHMOIDECTOMY Bilateral 11/09/2018   Procedure: Total ETHMOIDECTOMY with sphenoidotomy-Left Total Ethmoidectomy with Sphenoidectomy (Tissue Removal)-Right;  Surgeon: Margaretha Sheffield, MD;  Location: [place];  Service: ENT;  Laterality: Bilateral;   FRONTAL SINUS EXPLORATION Bilateral 11/09/2018   Procedure: FRONTAL SINUSotomy;  Surgeon: Margaretha Sheffield, MD;  Location: [place];  Service: ENT;  Laterality: Bilateral;   IMAGE GUIDED SINUS SURGERY Bilateral 11/09/2018   Procedure: IMAGE GUIDED SINUS SURGERY;  Surgeon: Margaretha Sheffield, MD;  Location: [place];  Service: ENT;  Laterality: Bilateral;  NEED STRYKER DISK   LUNG BIOPSY     MAXILLARY ANTROSTOMY Bilateral 11/09/2018   Procedure: MAXILLARY ANTROSTOMY with removal of tissue;  Surgeon: Margaretha Sheffield, MD;  Location: [place];  Service: ENT;  Laterality: Bilateral;   SEPTOPLASTY Bilateral 11/09/2018   Procedure: SEPTOPLASTY;  Surgeon: Margaretha Sheffield, MD;  Location: [place];  Service: ENT;  Laterality: Bilateral;  Gave disk to [place] Bilateral 11/09/2018   Procedure: TURBINATE REDUCTION-inferior;  Surgeon: Margaretha Sheffield, MD;  Location: [place];   Service: ENT;  Laterality: Bilateral;   VIDEO BRONCHOSCOPY WITH ENDOBRONCHIAL NAVIGATION N/A 12/22/2018   Procedure: VIDEO BRONCHOSCOPY WITH ENDOBRONCHIAL NAVIGATION;  Surgeon: Ottie Glazier, MD;  Location: [facility];  Service: Thoracic;  Laterality: N/A;   WISDOM TOOTH EXTRACTION      Prior to Admission medications   Medication Sig Start Date End Date Taking? Authorizing Provider  acetaminophen (TYLENOL) 325 MG tablet Take 650 mg by mouth every 6 (six) hours as needed. Patient not taking: No sig reported    [provider]  acyclovir (ZOVIRAX) 200 MG capsule Take by mouth. Patient not taking: No sig reported 01/16/19   [provider]  acyclovir (ZOVIRAX) 200 MG capsule TAKE 1 CAPSULE BY MOUTH 5 TIMES DAILY Patient not taking: No sig reported 02/12/20 02/11/21  Rusty Aus, MD  Fluticasone Propionate Truett Perna) 93 MCG/ACT EXHU Place 1 spray into both nostrils 2 (two) times daily.    [provider]  ibuprofen (ADVIL) 600 MG tablet Take by mouth. Patient not taking: No sig reported    [provider]  Multiple Vitamin (MULTIVITAMIN WITH MINERALS) TABS tablet Take 1 tablet by mouth daily.    [provider]  predniSONE (DELTASONE) 20 MG tablet TAKE 1 TABLET (20 MG TOTAL) BY MOUTH ONCE DAILY Patient not taking: No sig reported 12/07/19 12/06/20  Ottie Glazier, MD  predniSONE (DELTASONE) 5 MG tablet Take by mouth. Patient not taking: No sig reported    [provider]  riTUXimab in sodium chloride 0.9 % 250 mL Inject into the vein once.    [provider]  triamcinolone cream (KENALOG) 0.1 % APPLY TO THE AFFECTED AREA(S) TWICE DAILY FOR  FLARE/ITCH Patient not taking: No sig reported 05/10/19   [provider]    Allergies as of 09/24/2020   (No Known Allergies)    History reviewed. No pertinent family history.  Social History   Socioeconomic History   Marital status: Married    Spouse name: Not on file   Number of  children: Not on file   Years of education: Not on file   Highest education level: Not on file  Occupational History   Not on file  Tobacco Use   Smoking status: Former    Years: 14.00    Types: Cigarettes    Quit date: 2001    Years since quitting: 21.6   Smokeless tobacco: Former    Types: Chew    Quit date: 12/18/2017  Vaping Use   Vaping Use: Never used  Substance and Sexual Activity   Alcohol use: Not Currently    Alcohol/week: 3.0 standard drinks    Types: 3 Cans of beer per week   Drug use: Never   Sexual activity: Not on file  Other Topics Concern   Not on file  Social History Narrative   Not on file   Social Determinants of Health   Financial Resource Strain: Not on file  Food Insecurity: Not on file  Transportation Needs: Not on file  Physical Activity: Not on file  Stress: Not on file  Social Connections: Not on file  Intimate Partner Violence: Not on file    Review of Systems: See HPI, otherwise negative ROS  Physical Exam: BP 90/65   Pulse (!) 50   Temp (!) 96.3 F (35.7 C) (Temporal)   Resp 20   Ht '5\' 10"'$  (1.778 m)   Wt 88 kg   SpO2 100%   BMI 27.84 kg/m  General:   Alert,  pleasant and cooperative in NAD Head:  Normocephalic and atraumatic. Neck:  Supple; no masses or thyromegaly. Lungs:  Clear throughout to auscultation.    Heart:  Regular rate and rhythm. Abdomen:  Soft, nontender and nondistended. Normal bowel sounds, without guarding, and without rebound.   Neurologic:  Alert and  oriented x4;  grossly normal neurologically.  Impression/Plan: Thomas Gibbs is here for an flexible sigmoidoscopy to be performed for carcinoid tumor  Risks, benefits, limitations, and alternatives regarding  flexible sigmoidoscopy have been reviewed with the patient.  Questions have been answered.  All parties agreeable.   Lucilla Lame, MD  09/25/2020, 11:31 AM

## 2020-09-25 NOTE — Anesthesia Preprocedure Evaluation (Deleted)
Anesthesia Evaluation  Patient identified by MRN, date of birth, ID band Patient awake    Reviewed: Allergy & Precautions, NPO status   History of Anesthesia Complications Negative for: history of anesthetic complications  Airway Mallampati: I  TM Distance: >3 FB Neck ROM: Full    Dental no notable dental hx. (+) Teeth Intact   Pulmonary former smoker,    Pulmonary exam normal        Cardiovascular Exercise Tolerance: Good Normal cardiovascular exam+ dysrhythmias I     Neuro/Psych    GI/Hepatic   Endo/Other    Renal/GU      Musculoskeletal   Abdominal Normal abdominal exam  (+)   Peds  Hematology   Anesthesia Other Findings Dysrhythmia  tachycardia   Headache  sinus  Wegener's granulomatosis (granulomatosis with polyangiitis)       Reproductive/Obstetrics                             Anesthesia Physical  Anesthesia Plan  ASA: 1  Anesthesia Plan: General   Post-op Pain Management:    Induction:   PONV Risk Score and Plan:   Airway Management Planned: Simple Face Mask  Additional Equipment: None  Intra-op Plan:   Post-operative Plan:   Informed Consent: I have reviewed the patients History and Physical, chart, labs and discussed the procedure including the risks, benefits and alternatives for the proposed anesthesia with the patient or authorized representative who has indicated his/her understanding and acceptance.       Plan Discussed with: CRNA  Anesthesia Plan Comments:         Anesthesia Quick Evaluation

## 2020-09-25 NOTE — Telephone Encounter (Signed)
The pt has been scheduled for 10/30/20 at 845 am at [facility] with GM

## 2020-09-26 ENCOUNTER — Other Ambulatory Visit (HOSPITAL_COMMUNITY)
Admit: 2020-09-26 | Discharge: 2020-09-26 | Disposition: A | Discharge status: Home / Self Care | Payer: 59 | Source: Ambulatory Visit | Attending: Gastroenterology | Admitting: Gastroenterology

## 2020-09-26 ENCOUNTER — Encounter: Payer: Self-pay | Admitting: Gastroenterology

## 2020-09-26 DIAGNOSIS — K6289 Other specified diseases of anus and rectum: Secondary | ICD-10-CM | POA: Insufficient documentation

## 2020-09-26 NOTE — Telephone Encounter (Signed)
The pt has been instructed for the lower EUS/EMR on 9/21 at 845 am at [facility].  Instructions sent to the pt via mail and My Chart

## 2020-09-29 ENCOUNTER — Ambulatory Visit (HOSPITAL_COMMUNITY)
Admission: RE | Admit: 2020-09-29 | Discharge: 2020-09-29 | Disposition: A | Discharge status: Home / Self Care | Payer: 59 | Source: Ambulatory Visit | Attending: Gastroenterology | Admitting: Gastroenterology

## 2020-09-29 ENCOUNTER — Other Ambulatory Visit: Payer: Self-pay

## 2020-09-29 ENCOUNTER — Encounter (HOSPITAL_COMMUNITY): Payer: Self-pay

## 2020-09-29 DIAGNOSIS — I700 Atherosclerosis of aorta: Secondary | ICD-10-CM | POA: Diagnosis not present

## 2020-09-29 DIAGNOSIS — K5730 Diverticulosis of large intestine without perforation or abscess without bleeding: Secondary | ICD-10-CM | POA: Diagnosis not present

## 2020-09-29 DIAGNOSIS — C20 Malignant neoplasm of rectum: Secondary | ICD-10-CM | POA: Diagnosis not present

## 2020-09-29 DIAGNOSIS — K6289 Other specified diseases of anus and rectum: Secondary | ICD-10-CM | POA: Diagnosis not present

## 2020-09-29 LAB — CHROMOGRANIN A: Chromogranin A (ng/mL): 81.9 ng/mL (ref 0.0–101.8)

## 2020-09-29 MED ORDER — IOHEXOL 9 MG/ML PO SOLN
1000.0000 mL | ORAL | Status: AC
Start: 2020-09-29 — End: 2020-09-29
  Administered 2020-09-29: 1000 mL via ORAL

## 2020-09-29 MED ORDER — IOHEXOL 350 MG/ML SOLN
80.0000 mL | Freq: Once | INTRAVENOUS | Status: AC | PRN
  Administered 2020-09-29: 80 mL via INTRAVENOUS

## 2020-09-29 MED ORDER — IOHEXOL 9 MG/ML PO SOLN
ORAL | Status: AC
  Administered 2020-09-29: 1000 mL
  Filled 2020-09-29: qty 1000

## 2020-09-30 ENCOUNTER — Telehealth: Payer: Self-pay

## 2020-09-30 NOTE — Telephone Encounter (Signed)
-----   Message from Lucilla Lame, MD sent at 09/30/2020  7:32 AM EDT ----- Let the patient know that the blood tests did not show any increased levels of the tumor marker and the CT scan did not show any sign of enlarged lymph nodes or masses anywhere else.  The reported thickened rectum was likely from the injection of the tattoo material and does not represent any large lesions.

## 2020-09-30 NOTE — Telephone Encounter (Signed)
Pt notified of CT scan results.

## 2020-10-01 ENCOUNTER — Ambulatory Visit (HOSPITAL_COMMUNITY): Payer: 59

## 2020-10-14 ENCOUNTER — Other Ambulatory Visit (HOSPITAL_COMMUNITY): Payer: Self-pay

## 2020-10-29 ENCOUNTER — Encounter (HOSPITAL_COMMUNITY): Payer: Self-pay | Admitting: Gastroenterology

## 2020-10-29 NOTE — Progress Notes (Signed)
Spoke with wife Thomas Gibbs for [employer] and instructions for DOS.    DUE TO COVID-19 ONLY ONE VISITOR IS ALLOWED TO COME WITH YOU AND STAY IN THE WAITING ROOM ONLY DURING PRE OP AND PROCEDURE DAY OF SURGERY.   PCP/Internal Med -  Dr Emily Filbert Cardiologist - n/a Oncology - Dr Delight Hoh Rheumatology - Dr Percell Boston  CT Chest x-ray - 01/16/20 CE EKG - 12/19/18 Stress Test - n/a ECHO - n/a Cardiac Cath - n/a  ICD Pacemaker/Loop - n/a  Sleep Study -  n/a CPAP - none  Anesthesia review: Yes  STOP now taking any Aspirin (unless otherwise instructed by your surgeon), Aleve, Naproxen, Ibuprofen, Motrin, Advil, Goody's, BC's, all herbal medications, fish oil, and all vitamins.   Coronavirus Screening Covid test n/a Ambulatory Surgery Does patient have any of the following symptoms:  Cough yes/no: No Fever (>100.70F)  yes/no: No Runny nose yes/no: No Sore throat yes/no: No Difficulty breathing/shortness of breath  yes/no: No  Has patient traveled in the last 14 days and where? yes/no: No  Wife Thomas Gibbs verbalized understanding of instructions that were given via phone.

## 2020-10-30 ENCOUNTER — Encounter (HOSPITAL_COMMUNITY)
Admission: RE | Disposition: A | Discharge status: Home / Self Care | Payer: Self-pay | Source: Home / Self Care | Attending: Gastroenterology

## 2020-10-30 ENCOUNTER — Ambulatory Visit (HOSPITAL_COMMUNITY)
Admission: RE | Admit: 2020-10-30 | Discharge: 2020-10-30 | Disposition: A | Discharge status: Home / Self Care | Payer: 59 | Attending: Gastroenterology | Admitting: Gastroenterology

## 2020-10-30 ENCOUNTER — Ambulatory Visit (HOSPITAL_COMMUNITY): Payer: 59 | Admitting: Physician Assistant

## 2020-10-30 ENCOUNTER — Other Ambulatory Visit: Payer: Self-pay

## 2020-10-30 ENCOUNTER — Encounter (HOSPITAL_COMMUNITY): Payer: Self-pay | Admitting: Gastroenterology

## 2020-10-30 DIAGNOSIS — K6289 Other specified diseases of anus and rectum: Secondary | ICD-10-CM

## 2020-10-30 DIAGNOSIS — K641 Second degree hemorrhoids: Secondary | ICD-10-CM | POA: Insufficient documentation

## 2020-10-30 DIAGNOSIS — D3A026 Benign carcinoid tumor of the rectum: Secondary | ICD-10-CM | POA: Diagnosis not present

## 2020-10-30 DIAGNOSIS — Z87891 Personal history of nicotine dependence: Secondary | ICD-10-CM | POA: Diagnosis not present

## 2020-10-30 DIAGNOSIS — M3130 Wegener's granulomatosis without renal involvement: Secondary | ICD-10-CM | POA: Diagnosis not present

## 2020-10-30 DIAGNOSIS — K644 Residual hemorrhoidal skin tags: Secondary | ICD-10-CM | POA: Insufficient documentation

## 2020-10-30 DIAGNOSIS — Z9889 Other specified postprocedural states: Secondary | ICD-10-CM | POA: Diagnosis not present

## 2020-10-30 DIAGNOSIS — K626 Ulcer of anus and rectum: Secondary | ICD-10-CM | POA: Diagnosis not present

## 2020-10-30 HISTORY — PX: FLEXIBLE SIGMOIDOSCOPY: SHX5431

## 2020-10-30 HISTORY — PX: ENDOSCOPIC MUCOSAL RESECTION: SHX6839

## 2020-10-30 HISTORY — PX: SUBMUCOSAL LIFTING INJECTION: SHX6855

## 2020-10-30 HISTORY — PX: EUS: SHX5427

## 2020-10-30 HISTORY — PX: HEMOSTASIS CLIP PLACEMENT: SHX6857

## 2020-10-30 SURGERY — SIGMOIDOSCOPY, FLEXIBLE
Anesthesia: Monitor Anesthesia Care

## 2020-10-30 MED ORDER — PROPOFOL 500 MG/50ML IV EMUL
INTRAVENOUS | Status: DC | PRN
  Administered 2020-10-30 (×2): 125 ug/kg/min via INTRAVENOUS

## 2020-10-30 MED ORDER — PROPOFOL 10 MG/ML IV BOLUS: INTRAVENOUS | Status: DC | PRN

## 2020-10-30 MED ORDER — LIDOCAINE 2% (20 MG/ML) 5 ML SYRINGE
INTRAMUSCULAR | Status: DC | PRN
  Administered 2020-10-30: 40 mg via INTRAVENOUS

## 2020-10-30 MED ORDER — PHENYLEPHRINE 40 MCG/ML (10ML) SYRINGE FOR IV PUSH (FOR BLOOD PRESSURE SUPPORT)
PREFILLED_SYRINGE | INTRAVENOUS | Status: DC | PRN
  Administered 2020-10-30 (×2): 80 ug via INTRAVENOUS

## 2020-10-30 MED ORDER — PROPOFOL 10 MG/ML IV BOLUS
INTRAVENOUS | Status: DC | PRN
  Administered 2020-10-30: 50 mg via INTRAVENOUS

## 2020-10-30 MED ORDER — LACTATED RINGERS IV SOLN: INTRAVENOUS | Status: DC | PRN

## 2020-10-30 NOTE — Anesthesia Preprocedure Evaluation (Signed)
Anesthesia Evaluation  Patient identified by MRN, date of birth, ID band Patient awake    Reviewed: Allergy & Precautions, NPO status , Patient's Chart, lab work & pertinent test results  Airway Mallampati: II  TM Distance: >3 FB Neck ROM: Full    Dental  (+) Dental Advisory Given   Pulmonary former smoker,    breath sounds clear to auscultation       Cardiovascular negative cardio ROS   Rhythm:Regular Rate:Normal     Neuro/Psych negative neurological ROS     GI/Hepatic Neg liver ROS, Rectal carcinoid    Endo/Other  negative endocrine ROS  Renal/GU negative Renal ROS     Musculoskeletal   Abdominal   Peds  Hematology negative hematology ROS (+)   Anesthesia Other Findings   Reproductive/Obstetrics                             Anesthesia Physical Anesthesia Plan  ASA: 2  Anesthesia Plan: MAC   Post-op Pain Management:    Induction:   PONV Risk Score and Plan: 1 and Propofol infusion and Ondansetron  Airway Management Planned: Natural Airway and Simple Face Mask  Additional Equipment:   Intra-op Plan:   Post-operative Plan:   Informed Consent: I have reviewed the patients History and Physical, chart, labs and discussed the procedure including the risks, benefits and alternatives for the proposed anesthesia with the patient or authorized representative who has indicated his/her understanding and acceptance.       Plan Discussed with:   Anesthesia Plan Comments:         Anesthesia Quick Evaluation

## 2020-10-30 NOTE — H&P (Signed)
GASTROENTEROLOGY PROCEDURE H&P NOTE   Primary Care Physician: Rusty Aus, MD  HPI: Thomas Gibbs is a 50 y.o. 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  Past Medical History:  Diagnosis Date   BMI 28.0-28.9,adult    Dysrhythmia    tachycardia    Headache    sinus   Wegener's granulomatosis (granulomatosis with polyangiitis)    Past Surgical History:  Procedure Laterality Date   COLONOSCOPY WITH PROPOFOL N/A 09/16/2020   Procedure: COLONOSCOPY WITH PROPOFOL;  Surgeon: Lucilla Lame, MD;  Location: [facility];  Service: Endoscopy;  Laterality: N/A;   ETHMOIDECTOMY Bilateral 11/09/2018   Procedure: Total ETHMOIDECTOMY with sphenoidotomy-Left Total Ethmoidectomy with Sphenoidectomy (Tissue Removal)-Right;  Surgeon: Margaretha Sheffield, MD;  Location: [place];  Service: ENT;  Laterality: Bilateral;   FLEXIBLE SIGMOIDOSCOPY N/A 09/25/2020   Procedure: FLEXIBLE SIGMOIDOSCOPY;  Surgeon: Lucilla Lame, MD;  Location: [facility];  Service: Endoscopy;  Laterality: N/A;   FRONTAL SINUS EXPLORATION Bilateral 11/09/2018   Procedure: FRONTAL SINUSotomy;  Surgeon: Margaretha Sheffield, MD;  Location: [place];  Service: ENT;  Laterality: Bilateral;   IMAGE GUIDED SINUS SURGERY Bilateral 11/09/2018   Procedure: IMAGE GUIDED SINUS SURGERY;  Surgeon: Margaretha Sheffield, MD;  Location: [place];  Service: ENT;  Laterality: Bilateral;  NEED STRYKER DISK   LUNG BIOPSY     MAXILLARY ANTROSTOMY Bilateral 11/09/2018   Procedure: MAXILLARY ANTROSTOMY with removal of tissue;  Surgeon: Margaretha Sheffield, MD;  Location: [place];  Service: ENT;  Laterality: Bilateral;   SEPTOPLASTY Bilateral 11/09/2018   Procedure: SEPTOPLASTY;  Surgeon: Margaretha Sheffield, MD;  Location: [place];  Service: ENT;  Laterality: Bilateral;  Gave disk to [place] Bilateral 11/09/2018   Procedure: TURBINATE REDUCTION-inferior;  Surgeon: Margaretha Sheffield, MD;  Location: [place];  Service: ENT;  Laterality: Bilateral;   VIDEO BRONCHOSCOPY WITH ENDOBRONCHIAL NAVIGATION N/A 12/22/2018   Procedure: VIDEO BRONCHOSCOPY WITH ENDOBRONCHIAL NAVIGATION;  Surgeon: Ottie Glazier, MD;  Location: [facility];  Service: Thoracic;  Laterality: N/A;   WISDOM TOOTH EXTRACTION     No current facility-administered medications for this encounter.   No current facility-administered medications for this encounter. No Known Allergies History reviewed. No pertinent family history. Social History   Socioeconomic History   Marital status: Married    Spouse name: Not on file   Number of children: Not on file   Years of education: Not on file   Highest education level: Not on file  Occupational History   Not on file  Tobacco Use   Smoking status: Former    Years: 14.00    Types: Cigarettes    Quit date: 2001    Years since quitting: 21.7   Smokeless tobacco: Former    Types: Chew    Quit date: 12/18/2017  Vaping Use   Vaping Use: Never used  Substance and Sexual Activity   Alcohol use: Yes    Alcohol/week: 3.0 standard drinks    Types: 3 Cans of beer per week   Drug use: Never   Sexual activity: Yes  Other Topics Concern   Not on file  Social History Narrative   Not on file   Social Determinants of Health   Financial Resource Strain: Not on file  Food Insecurity: Not on file  Transportation Needs: Not on file  Physical Activity: Not on file  Stress: Not on  file  Social Connections: Not on file  Intimate Partner Violence: Not on file    Physical Exam: Today's Vitals   10/29/20 0902  Weight: 89.8 kg  Height: 5\' 10"  (1.778 m)   Body mass index is 28.41 kg/m. GEN: NAD EYE: Sclerae anicteric ENT: MMM CV: Non-tachycardic GI: Soft, NT/ND NEURO:  Alert & Oriented x 3  Lab Results: No results for input(s): WBC, HGB, HCT, PLT in  the last 72 hours. BMET No results for input(s): NA, K, CL, CO2, GLUCOSE, BUN, CREATININE, CALCIUM in the last 72 hours. LFT No results for input(s): PROT, ALBUMIN, AST, ALT, ALKPHOS, BILITOT, BILIDIR, IBILI in the last 72 hours. PT/INR No results for input(s): LABPROT, INR in the last 72 hours.   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  Based upon the description and endoscopic pictures I do feel that it is reasonable to pursue an Advanced Polypectomy attempt of the polyp/lesion.  We discussed some of the techniques of advanced polypectomy which include Endoscopic Mucosal Resection, OVESCO Full-Thickness Resection, Endorotor Morcellation, and Tissue Ablation via Fulguration.  We also reviewed images of typical techniques as noted above.  The risks and benefits of endoscopic evaluation were discussed with the patient; these include but are not limited to the risk of perforation, infection, bleeding, missed lesions, lack of diagnosis, severe illness requiring hospitalization, as well as anesthesia and sedation related illnesses.  During attempts at advanced resection, the risks of bleeding and perforation/leak are increased as opposed to diagnostic and screening procedures, and that was discussed with the patient as well.   In addition, I explained that with the possible need for piecemeal resection, subsequent short-interval endoscopic evaluation for follow up and potential retreatment of the lesion/area may be necessary.  If, after attempt at removal of the polyp/lesion, it is found that the patient has a complication or that an invasive lesion or malignant lesion is found, or that the polyp/lesion continues to recur, the patient is aware and understands that surgery may still be indicated/required.  All patient questions were answered, to the best of my  ability, and the patient agrees to the aforementioned plan of action with follow-up as indicated.  The risks of an EUS including intestinal perforation, bleeding, infection, aspiration, and medication effects were discussed as was the possibility it may not give a definitive diagnosis if a biopsy is performed.    The risks and benefits of endoscopic evaluation/treatment were discussed with the patient and/or family; these include but are not limited to the risk of perforation, infection, bleeding, missed lesions, lack of diagnosis, severe illness requiring hospitalization, as well as anesthesia and sedation related illnesses.  The patient's history has been reviewed, patient examined, no change in status, and deemed stable for procedure.  The patient and/or family is agreeable to proceed.    Justice Britain, MD [facility] Advanced Endoscopy Office # [phone]

## 2020-10-30 NOTE — Transfer of Care (Signed)
Immediate Anesthesia Transfer of Care Note  Patient: GIOVAN PINSKY  Procedure(s) Performed: FLEXIBLE SIGMOIDOSCOPY LOWER ENDOSCOPIC ULTRASOUND (EUS) ENDOSCOPIC MUCOSAL RESECTION SUBMUCOSAL LIFTING INJECTION HEMOSTASIS CLIP PLACEMENT  Patient Location: PACU  Anesthesia Type:MAC  Level of Consciousness: drowsy  Airway & Oxygen Therapy: Patient Spontanous Breathing and Patient connected to nasal cannula oxygen  Post-op Assessment: Report given to RN and Post -op Vital signs reviewed and stable  Post vital signs: Reviewed and stable  Last Vitals:  Vitals Value Taken Time  BP 101/68 10/30/20 1025  Temp    Pulse 62 10/30/20 1027  Resp 13 10/30/20 1027  SpO2 100 % 10/30/20 1027  Vitals shown include unvalidated device data.  Last Pain:  Vitals:   10/30/20 0735  TempSrc: Temporal  PainSc: 0-No pain         Complications: No notable events documented.

## 2020-10-30 NOTE — Anesthesia Postprocedure Evaluation (Signed)
Anesthesia Post Note  Patient: Thomas Gibbs  Procedure(s) Performed: FLEXIBLE SIGMOIDOSCOPY LOWER ENDOSCOPIC ULTRASOUND (EUS) ENDOSCOPIC MUCOSAL RESECTION SUBMUCOSAL LIFTING INJECTION HEMOSTASIS CLIP PLACEMENT     Patient location during evaluation: PACU Anesthesia Type: MAC Level of consciousness: awake and alert Pain management: pain level controlled Vital Signs Assessment: post-procedure vital signs reviewed and stable Respiratory status: spontaneous breathing, nonlabored ventilation, respiratory function stable and patient connected to nasal cannula oxygen Cardiovascular status: stable and blood pressure returned to baseline Postop Assessment: no apparent nausea or vomiting Anesthetic complications: no   No notable events documented.  Last Vitals:  Vitals:   10/30/20 1056 10/30/20 1059  BP: 105/73   Pulse: (!) 44 (!) 50  Resp: 17 16  Temp: 36.5 C   SpO2: 100% 100%    Last Pain:  Vitals:   10/30/20 1026  TempSrc:   PainSc: Asleep                 Tiajuana Amass

## 2020-10-30 NOTE — Op Note (Signed)
[facility] Patient Name: Thomas Gibbs Procedure Date : 10/30/2020 MRN: [id number] Attending MD: Justice Britain , MD Date of Birth: [birth year] CSN: [id number] Age: 50 Admit Type: Outpatient Procedure:                Lower EUS Indications:              Rectal deformity found on endoscopy; subepithelial                            tumor versus extrinsic compression, Neuroendocrine                            Tumor Providers:                Justice Britain, MD, Jeanella Cara, RN,                            Cletis Athens, Technician Referring MD:             Lucilla Lame MD, MD, Enrique Sack, MD Medicines:                Monitored Anesthesia Care Complications:            No immediate complications. Estimated Blood Loss:     Estimated blood loss was minimal. Procedure:                Pre-Anesthesia Assessment:                           - Prior to the procedure, a History and Physical                            was performed, and patient medications and                            allergies were reviewed. The patient's tolerance of                            previous anesthesia was also reviewed. The risks                            and benefits of the procedure and the sedation                            options and risks were discussed with the patient.                            All questions were answered, and informed consent                            was obtained. Prior Anticoagulants: The patient has                            taken no previous anticoagulant or antiplatelet  agents. ASA Grade Assessment: II - A patient with                            mild systemic disease. After reviewing the risks                            and benefits, the patient was deemed in                            satisfactory condition to undergo the procedure.                           After obtaining informed consent, the endoscope was                             passed under direct vision. Throughout the                            procedure, the patient's blood pressure, pulse, and                            oxygen saturations were monitored continuously. The                            [id number] ([id number]) Olympus therapeutic                            gastroscope was introduced through the anus and                            advanced to the the cecum, identified by                            appendiceal orifice and ileocecal valve. After                            obtaining informed consent, the endoscope was                            passed under direct vision. Throughout the                            procedure, the patient's blood pressure, pulse, and                            oxygen saturations were monitored continuously. The                            [id number] ([id number]) Olympus Radial EUS scope was                            introduced through the anus and advanced to the the  rectosigmoid junction for ultrasound. The lower EUS                            was accomplished without difficulty. The patient                            tolerated the procedure. The quality of the bowel                            preparation was adequate. Scope In: 9:24:37 AM Scope Out: 9:40:00 AM Scope Withdrawal Time: 0 hours 10 minutes 5 seconds  Total Procedure Duration: 0 hours 15 minutes 23 seconds  Findings:      ENDOSCOPIC FINDING: :      A moderate amount of liquid stool was found in the entire colon, making       visualization partially difficult. Lavage of the area was performed       using copious amounts, resulting in clearance with adequate       visualization.      Normal mucosa was found in the entire colon otherwise.      A small post polypectomy scar was found in the distal rectum. There was       residual polypoid tissue. Adjacent mucosal findings include       discoloration from prior tattoo  placement. Preparations were made for       mucosal resection. NBI imaging and White-light endoscopy was done to       demarcate the borders of the lesion. Orise gel was injected to raise the       lesion. Band ligator and snare mucosal resection was performed.       Resection and retrieval were complete. To prevent bleeding after mucosal       resection, five hemostatic clips were successfully placed (MR       conditional). There was no bleeding during, or at the end, of the       procedure.      Non-bleeding non-thrombosed external and internal hemorrhoids were found       during retroflexion, during perianal exam and during digital exam. The       hemorrhoids were Grade II (internal hemorrhoids that prolapse but reduce       spontaneously).      ENDOSONOGRAPHIC FINDING: :      The rectosigmoid junction was normal.      Wall thickening was found in the rectum. This was encountered from 5 to       6 cm (from the anal verge). The site of thickening was       non-circumferential and located predominantly at the posterior rectal       wal and is at the site of the scarl. This finding appeared to be       primarily due to increased thickness of the deep mucosa (Layer 2) and       submucosa (Layer 3).      The perirectal space was normal.      The internal anal sphincter was visualized endosonographically and       appeared normal. Impression:               Colonoscopy Impression:                           -  Stool in the entire examined colon - lavaged with                            adequate visualization.                           - Normal mucosa in the entire examined colon                            otherwise.                           - Post-polypectomy scar in the distal rectum. After                            EUS completed, Band ligation EMR performed. Clips                            (MR conditional) were placed.                           - Non-bleeding non-thrombosed external and  internal                            hemorrhoids.                           EUS Impression:                           - Endosonographic imaging showed no sign of                            significant pathology at the rectosigmoid junction.                           - Wall thickening was visualized                            endosonographically in the rectum. This was due to                            increased thickness of the deep mucosa (Layer 2)                            and submucosa (Layer 3) - from previous polypectomy.                           - Endosonographic images of the perirectal space                            were unremarkable.                           - The internal anal sphincter was visualized  endosonographically and appeared normal. Recommendation:           - The patient will be observed post-procedure,                            until all discharge criteria are met.                           - Discharge patient to home.                           - High fiber diet.                           - Use FiberCon 1-2 tablets PO daily.                           - Await path results.                           - Repeat lower endoscopic ultrasound in 1 year for                            surveillance if NET is found but negative margins,                            otherwise consider repeat EUS in 20-month.                           - The findings and recommendations were discussed                            with the patient.                           - The findings and recommendations were discussed                            with the patient's family. Procedure Code(s):        --- Professional ---                           [id number] Colonoscopy, flexible; with endoscopic                            mucosal resection                           45391, Colonoscopy, flexible; with endoscopic                            ultrasound examination limited to the  rectum,                            sigmoid, descending, transverse, or ascending colon  and cecum, and adjacent structures Diagnosis Code(s):        --- Professional ---                           K64.1, Second degree hemorrhoids                           Z98.890, Other specified postprocedural states                           K62.89, Other specified diseases of anus and rectum CPT copyright 2019 [employer]. All rights reserved. The codes documented in this report are preliminary and upon coder review may  be revised to meet current compliance requirements. Justice Britain, MD 10/30/2020 10:36:29 AM Number of Addenda: 0

## 2020-10-30 NOTE — Anesthesia Procedure Notes (Signed)
Procedure Name: MAC Date/Time: 10/30/2020 9:16 AM Performed by: Reece Agar, CRNA Pre-anesthesia Checklist: Patient identified, Emergency Drugs available, Suction available and Patient being monitored Patient Re-evaluated:Patient Re-evaluated prior to induction Oxygen Delivery Method: Nasal cannula

## 2020-10-31 ENCOUNTER — Encounter (HOSPITAL_COMMUNITY): Payer: Self-pay | Admitting: Gastroenterology

## 2020-10-31 LAB — SURGICAL PATHOLOGY

## 2020-11-04 ENCOUNTER — Encounter: Payer: Self-pay | Admitting: Gastroenterology

## 2020-12-04 DIAGNOSIS — M3130 Wegener's granulomatosis without renal involvement: Secondary | ICD-10-CM | POA: Diagnosis not present

## 2020-12-04 DIAGNOSIS — E538 Deficiency of other specified B group vitamins: Secondary | ICD-10-CM | POA: Diagnosis not present

## 2020-12-04 DIAGNOSIS — I776 Arteritis, unspecified: Secondary | ICD-10-CM | POA: Diagnosis not present

## 2020-12-08 DIAGNOSIS — H04222 Epiphora due to insufficient drainage, left lacrimal gland: Secondary | ICD-10-CM | POA: Diagnosis not present

## 2020-12-08 DIAGNOSIS — H04552 Acquired stenosis of left nasolacrimal duct: Secondary | ICD-10-CM | POA: Diagnosis not present

## 2020-12-31 DIAGNOSIS — H6982 Other specified disorders of Eustachian tube, left ear: Secondary | ICD-10-CM | POA: Diagnosis not present

## 2020-12-31 DIAGNOSIS — M3130 Wegener's granulomatosis without renal involvement: Secondary | ICD-10-CM | POA: Diagnosis not present

## 2020-12-31 DIAGNOSIS — J320 Chronic maxillary sinusitis: Secondary | ICD-10-CM | POA: Diagnosis not present

## 2020-12-31 DIAGNOSIS — H90A22 Sensorineural hearing loss, unilateral, left ear, with restricted hearing on the contralateral side: Secondary | ICD-10-CM | POA: Diagnosis not present

## 2021-01-14 DIAGNOSIS — D225 Melanocytic nevi of trunk: Secondary | ICD-10-CM | POA: Diagnosis not present

## 2021-01-14 DIAGNOSIS — L570 Actinic keratosis: Secondary | ICD-10-CM | POA: Diagnosis not present

## 2021-01-14 DIAGNOSIS — Z8616 Personal history of COVID-19: Secondary | ICD-10-CM | POA: Diagnosis not present

## 2021-01-14 DIAGNOSIS — L821 Other seborrheic keratosis: Secondary | ICD-10-CM | POA: Diagnosis not present

## 2021-01-14 DIAGNOSIS — L814 Other melanin hyperpigmentation: Secondary | ICD-10-CM | POA: Diagnosis not present

## 2021-01-29 ENCOUNTER — Other Ambulatory Visit: Payer: Self-pay

## 2021-01-29 DIAGNOSIS — J40 Bronchitis, not specified as acute or chronic: Secondary | ICD-10-CM | POA: Diagnosis not present

## 2021-01-29 DIAGNOSIS — R059 Cough, unspecified: Secondary | ICD-10-CM | POA: Diagnosis not present

## 2021-01-29 DIAGNOSIS — R058 Other specified cough: Secondary | ICD-10-CM | POA: Diagnosis not present

## 2021-01-29 MED ORDER — AZITHROMYCIN 250 MG PO TABS
ORAL_TABLET | ORAL | 0 refills | Status: DC
  Filled 2021-01-29: qty 6, 5d supply, fill #0

## 2021-01-29 MED ORDER — HYDROCOD POLST-CPM POLST ER 10-8 MG/5ML PO SUER
ORAL | 0 refills | Status: DC
  Filled 2021-01-29: qty 115, 12d supply, fill #0

## 2021-01-29 MED ORDER — PREDNISONE 20 MG PO TABS
ORAL_TABLET | ORAL | 0 refills | Status: DC
  Filled 2021-01-29: qty 9, 6d supply, fill #0

## 2021-03-09 ENCOUNTER — Other Ambulatory Visit: Payer: Self-pay

## 2021-03-09 ENCOUNTER — Encounter: Payer: Self-pay | Admitting: Nurse Practitioner

## 2021-03-09 ENCOUNTER — Inpatient Hospital Stay: Payer: 59 | Attending: Oncology | Admitting: Nurse Practitioner

## 2021-03-09 ENCOUNTER — Inpatient Hospital Stay: Payer: 59

## 2021-03-09 VITALS — BP 109/74 | HR 74 | Temp 98.7°F | Resp 20 | Wt 203.2 lb

## 2021-03-09 VITALS — BP 106/68 | HR 73 | Temp 98.0°F | Resp 18

## 2021-03-09 DIAGNOSIS — M3130 Wegener's granulomatosis without renal involvement: Secondary | ICD-10-CM | POA: Diagnosis not present

## 2021-03-09 DIAGNOSIS — Z79899 Other long term (current) drug therapy: Secondary | ICD-10-CM | POA: Diagnosis not present

## 2021-03-09 DIAGNOSIS — Z5112 Encounter for antineoplastic immunotherapy: Secondary | ICD-10-CM | POA: Insufficient documentation

## 2021-03-09 DIAGNOSIS — Z87891 Personal history of nicotine dependence: Secondary | ICD-10-CM | POA: Insufficient documentation

## 2021-03-09 MED ORDER — SODIUM CHLORIDE 0.9 % IV SOLN
500.0000 mg | Freq: Once | INTRAVENOUS | Status: AC
  Administered 2021-03-09: 500 mg via INTRAVENOUS
  Filled 2021-03-09: qty 50

## 2021-03-09 MED ORDER — METHYLPREDNISOLONE SODIUM SUCC 125 MG IJ SOLR
100.0000 mg | Freq: Once | INTRAMUSCULAR | Status: AC
  Administered 2021-03-09: 100 mg via INTRAVENOUS
  Filled 2021-03-09: qty 2

## 2021-03-09 MED ORDER — DIPHENHYDRAMINE HCL 50 MG/ML IJ SOLN
50.0000 mg | Freq: Once | INTRAMUSCULAR | Status: AC
  Administered 2021-03-09: 50 mg via INTRAVENOUS
  Filled 2021-03-09: qty 1

## 2021-03-09 MED ORDER — SODIUM CHLORIDE 0.9 % IV SOLN
Freq: Once | INTRAVENOUS | Status: AC
  Filled 2021-03-09: qty 250

## 2021-03-09 MED ORDER — ACETAMINOPHEN 325 MG PO TABS
650.0000 mg | ORAL_TABLET | Freq: Once | ORAL | Status: AC
  Administered 2021-03-09: 650 mg via ORAL
  Filled 2021-03-09: qty 2

## 2021-03-09 NOTE — Progress Notes (Signed)
[place]  Telephone:(336) [phone] Fax:(336) [phone]  ID: Thomas Gibbs OB: 10-Nov-1970  MR#: [id number]  TMH#:[id number]  Patient Care Team: Rusty Aus, MD as PCP - General (Internal Medicine)  CHIEF COMPLAINT: Wegener's granulomatosis.   INTERVAL HISTORY: Patient returns to clinic today for routine 47-month evaluation and continuation of Ruxience.  He continues to feel well and remains asymptomatic. He has discussed reducing dose and possibly coming off of rituxan in the future if possible. Symptoms continue to be well controlled though. He has no neurologic complaints.  He denies any recent fevers or illnesses.  He has a good appetite and denies weight loss.  He has no chest pain, shortness of breath, cough, or hemoptysis.  He denies any nausea, vomiting, constipation, or diarrhea.  He has no urinary complaints.  Patient feels at his baseline offers no specific complaints today.  REVIEW OF SYSTEMS:   Review of Systems  Constitutional: Negative.  Negative for fever, malaise/fatigue and weight loss.  Respiratory: Negative.  Negative for cough, hemoptysis and shortness of breath.   Cardiovascular:  Negative for chest pain and leg swelling.  Gastrointestinal: Negative.  Negative for abdominal pain.  Genitourinary: Negative.  Negative for dysuria.  Musculoskeletal: Negative.  Negative for back pain.  Skin: Negative.  Negative for rash.  Neurological: Negative.  Negative for dizziness, focal weakness, weakness and headaches.  Psychiatric/Behavioral: Negative.  The patient is not nervous/anxious.   As per HPI. Otherwise, a complete review of systems is negative.  PAST MEDICAL HISTORY: Past Medical History:  Diagnosis Date   BMI 28.0-28.9,adult    Dysrhythmia    tachycardia    Headache    sinus   Wegener's granulomatosis (granulomatosis with polyangiitis)     PAST SURGICAL HISTORY: Past Surgical History:  Procedure Laterality Date   COLONOSCOPY WITH  PROPOFOL N/A 09/16/2020   Procedure: COLONOSCOPY WITH PROPOFOL;  Surgeon: Lucilla Lame, MD;  Location: [facility] ENDOSCOPY;  Service: Endoscopy;  Laterality: N/A;   ENDOSCOPIC MUCOSAL RESECTION N/A 10/30/2020   Procedure: ENDOSCOPIC MUCOSAL RESECTION;  Surgeon: Rush Landmark Telford Nab., MD;  Location: [place];  Service: Gastroenterology;  Laterality: N/A;   ETHMOIDECTOMY Bilateral 11/09/2018   Procedure: Total ETHMOIDECTOMY with sphenoidotomy-Left Total Ethmoidectomy with Sphenoidectomy (Tissue Removal)-Right;  Surgeon: Margaretha Sheffield, MD;  Location: [place];  Service: ENT;  Laterality: Bilateral;   EUS N/A 10/30/2020   Procedure: LOWER ENDOSCOPIC ULTRASOUND (EUS);  Surgeon: Irving Copas., MD;  Location: [place];  Service: Gastroenterology;  Laterality: N/A;   FLEXIBLE SIGMOIDOSCOPY N/A 09/25/2020   Procedure: FLEXIBLE SIGMOIDOSCOPY;  Surgeon: Lucilla Lame, MD;  Location: [facility] ENDOSCOPY;  Service: Endoscopy;  Laterality: N/A;   FLEXIBLE SIGMOIDOSCOPY N/A 10/30/2020   Procedure: FLEXIBLE SIGMOIDOSCOPY;  Surgeon: Rush Landmark Telford Nab., MD;  Location: [place];  Service: Gastroenterology;  Laterality: N/A;   FRONTAL SINUS EXPLORATION Bilateral 11/09/2018   Procedure: FRONTAL SINUSotomy;  Surgeon: Margaretha Sheffield, MD;  Location: [place];  Service: ENT;  Laterality: Bilateral;   HEMOSTASIS CLIP PLACEMENT  10/30/2020   Procedure: HEMOSTASIS CLIP PLACEMENT;  Surgeon: Irving Copas., MD;  Location: [place];  Service: Gastroenterology;;   IMAGE GUIDED SINUS SURGERY Bilateral 11/09/2018   Procedure: IMAGE GUIDED SINUS SURGERY;  Surgeon: Margaretha Sheffield, MD;  Location: [place];  Service: ENT;  Laterality: Bilateral;  NEED STRYKER DISK   LUNG BIOPSY     MAXILLARY ANTROSTOMY Bilateral 11/09/2018   Procedure: MAXILLARY ANTROSTOMY with removal of tissue;  Surgeon: Margaretha Sheffield, MD;  Location: [place]  [facility];  Service: ENT;  Laterality: Bilateral;    SEPTOPLASTY Bilateral 11/09/2018   Procedure: SEPTOPLASTY;  Surgeon: Margaretha Sheffield, MD;  Location: [place];  Service: ENT;  Laterality: Bilateral;  Gave disk to [place] INJECTION  10/30/2020   Procedure: SUBMUCOSAL LIFTING INJECTION;  Surgeon: Irving Copas., MD;  Location: [place];  Service: Gastroenterology;;   Albin Felling REDUCTION Bilateral 11/09/2018   Procedure: Albin Felling REDUCTION-inferior;  Surgeon: Margaretha Sheffield, MD;  Location: [place];  Service: ENT;  Laterality: Bilateral;   VIDEO BRONCHOSCOPY WITH ENDOBRONCHIAL NAVIGATION N/A 12/22/2018   Procedure: VIDEO BRONCHOSCOPY WITH ENDOBRONCHIAL NAVIGATION;  Surgeon: Ottie Glazier, MD;  Location: [facility]S;  Service: Thoracic;  Laterality: N/A;   WISDOM TOOTH EXTRACTION      FAMILY HISTORY: History reviewed. No pertinent family history.  ADVANCED DIRECTIVES (Y/N):  N  HEALTH MAINTENANCE: Social History   Tobacco Use   Smoking status: Former    Years: 14.00    Types: Cigarettes    Quit date: 2001    Years since quitting: 22.0   Smokeless tobacco: Former    Types: Chew    Quit date: 12/18/2017  Vaping Use   Vaping Use: Never used  Substance Use Topics   Alcohol use: Yes    Alcohol/week: 3.0 standard drinks    Types: 3 Cans of beer per week   Drug use: Never     Colonoscopy:  PAP:  Bone density:  Lipid panel:  No Known Allergies  Current Outpatient Medications  Medication Sig Dispense Refill   Fluticasone Propionate (XHANCE) 93 MCG/ACT EXHU Place 1 spray into both nostrils 2 (two) times daily.     azithromycin (ZITHROMAX) 250 MG tablet Take 2 tablets by mouth on day one, then 1 tablet daily days 2-5. (Patient not taking: Reported on 03/09/2021) 6 tablet 0   chlorpheniramine-HYDROcodone (TUSSIONEX) 10-8 MG/5ML SUER Take 5 mLs by mouth every 12 (twelve) hours as needed for Cough (Patient not taking: Reported on 03/09/2021) 115 mL 0   predniSONE (DELTASONE) 20 MG  tablet Take 2 tablets by mouth daily for 3 days then 1 tablet daily for 3 days (Patient not taking: Reported on 03/09/2021) 9 tablet 0   No current facility-administered medications for this visit.    OBJECTIVE: Vitals:   03/09/21 0851  BP: 109/74  Pulse: 74  Resp: 20  Temp: 98.7 F (37.1 C)  SpO2: 99%     Body mass index is 29.16 kg/m.    ECOG FS:0 - Asymptomatic  General: Well-developed, well-nourished, no acute distress. Eyes: Pink conjunctiva, anicteric sclera. Lungs: Clear to auscultation bilaterally.  No audible wheezing or coughing Heart: Regular rate and rhythm.  Abdomen: Soft, nontender, nondistended.  Musculoskeletal: No edema, cyanosis, or clubbing. Neuro: Alert, answering all questions appropriately. Cranial nerves grossly intact. Skin: No rashes or petechiae noted. Psych: Normal affect.   LAB RESULTS:  No results found for: NA, K, CL, CO2, GLUCOSE, BUN, CREATININE, CALCIUM, PROT, ALBUMIN, AST, ALT, ALKPHOS, BILITOT, GFRNONAA, GFRAA  Lab Results  Component Value Date   WBC 10.2 12/19/2018   HGB 12.4 (L) 12/19/2018   HCT 39.5 12/19/2018   MCV 92.7 12/19/2018   PLT 400 12/19/2018     STUDIES: No results found.  ASSESSMENT: Wegener's granulomatosis.   PLAN:   1. Wegener's granulomatosis: Proceed with 500 mg IV Ruxience (bio similar to Rituxan) today. Per rheumatology, trialing medication at lower dose. Patient expressed understanding that all laboratory work and any symptoms related to his  Wegener's will be monitored and evaluated per rheumatology.  Case previously discussed with Dr. Jefm Bryant.  Patient no longer requires his day 15 infusion.  Return to clinic in 6 months for further evaluation and continuation of treatment if needed.  I spent a total of 30 minutes reviewing chart data, face-to-face evaluation with the patient, counseling and coordination of care as detailed above.  Patient expressed understanding and was in agreement with this plan. He  also understands that He can call clinic at any time with any questions, concerns, or complaints.    Verlon Au, NP   03/09/2021

## 2021-03-11 DIAGNOSIS — Z125 Encounter for screening for malignant neoplasm of prostate: Secondary | ICD-10-CM | POA: Diagnosis not present

## 2021-03-11 DIAGNOSIS — Z13220 Encounter for screening for lipoid disorders: Secondary | ICD-10-CM | POA: Diagnosis not present

## 2021-03-11 DIAGNOSIS — Z0000 Encounter for general adult medical examination without abnormal findings: Secondary | ICD-10-CM | POA: Diagnosis not present

## 2021-03-11 DIAGNOSIS — Z131 Encounter for screening for diabetes mellitus: Secondary | ICD-10-CM | POA: Diagnosis not present

## 2021-03-11 DIAGNOSIS — E538 Deficiency of other specified B group vitamins: Secondary | ICD-10-CM | POA: Diagnosis not present

## 2021-03-18 DIAGNOSIS — Z0000 Encounter for general adult medical examination without abnormal findings: Secondary | ICD-10-CM | POA: Diagnosis not present

## 2021-03-18 DIAGNOSIS — I6523 Occlusion and stenosis of bilateral carotid arteries: Secondary | ICD-10-CM | POA: Diagnosis not present

## 2021-04-13 DIAGNOSIS — I6523 Occlusion and stenosis of bilateral carotid arteries: Secondary | ICD-10-CM | POA: Diagnosis not present

## 2021-06-17 ENCOUNTER — Other Ambulatory Visit (HOSPITAL_COMMUNITY): Payer: Self-pay

## 2021-06-17 DIAGNOSIS — M5010 Cervical disc disorder with radiculopathy, unspecified cervical region: Secondary | ICD-10-CM | POA: Diagnosis not present

## 2021-06-17 DIAGNOSIS — M50122 Cervical disc disorder at C5-C6 level with radiculopathy: Secondary | ICD-10-CM | POA: Diagnosis not present

## 2021-06-17 MED ORDER — ETODOLAC 400 MG PO TABS
ORAL_TABLET | ORAL | 11 refills | Status: DC
  Filled 2021-06-17: qty 60, 30d supply, fill #0

## 2021-06-17 MED ORDER — PREDNISONE 10 MG PO TABS
ORAL_TABLET | ORAL | 0 refills | Status: DC
  Filled 2021-06-17: qty 10, 10d supply, fill #0

## 2021-06-17 MED ORDER — GABAPENTIN 100 MG PO CAPS
100.0000 mg | ORAL_CAPSULE | Freq: Every evening | ORAL | 1 refills | Status: DC
  Filled 2021-06-17: qty 50, 16d supply, fill #0

## 2021-06-30 DIAGNOSIS — M5010 Cervical disc disorder with radiculopathy, unspecified cervical region: Secondary | ICD-10-CM | POA: Diagnosis not present

## 2021-07-01 DIAGNOSIS — J320 Chronic maxillary sinusitis: Secondary | ICD-10-CM | POA: Diagnosis not present

## 2021-07-01 DIAGNOSIS — M3130 Wegener's granulomatosis without renal involvement: Secondary | ICD-10-CM | POA: Diagnosis not present

## 2021-07-30 DIAGNOSIS — H04222 Epiphora due to insufficient drainage, left lacrimal gland: Secondary | ICD-10-CM | POA: Diagnosis not present

## 2021-07-31 ENCOUNTER — Other Ambulatory Visit: Payer: Self-pay

## 2021-07-31 DIAGNOSIS — M3130 Wegener's granulomatosis without renal involvement: Secondary | ICD-10-CM | POA: Diagnosis not present

## 2021-07-31 DIAGNOSIS — J320 Chronic maxillary sinusitis: Secondary | ICD-10-CM | POA: Diagnosis not present

## 2021-07-31 DIAGNOSIS — J019 Acute sinusitis, unspecified: Secondary | ICD-10-CM | POA: Diagnosis not present

## 2021-07-31 MED ORDER — PREDNISONE 10 MG PO TABS
ORAL_TABLET | ORAL | 0 refills | Status: DC
  Filled 2021-07-31: qty 21, 6d supply, fill #0

## 2021-08-03 ENCOUNTER — Other Ambulatory Visit (HOSPITAL_COMMUNITY): Payer: Self-pay

## 2021-08-03 MED ORDER — DOXYCYCLINE HYCLATE 100 MG PO CAPS
100.0000 mg | ORAL_CAPSULE | Freq: Two times a day (BID) | ORAL | 0 refills | Status: DC
  Filled 2021-08-03: qty 14, 7d supply, fill #0

## 2021-08-31 ENCOUNTER — Encounter: Payer: Self-pay | Admitting: Nurse Practitioner

## 2021-08-31 DIAGNOSIS — M3130 Wegener's granulomatosis without renal involvement: Secondary | ICD-10-CM | POA: Diagnosis not present

## 2021-08-31 DIAGNOSIS — Z7960 Long term (current) use of unspecified immunomodulators and immunosuppressants: Secondary | ICD-10-CM | POA: Diagnosis not present

## 2021-08-31 DIAGNOSIS — I776 Arteritis, unspecified: Secondary | ICD-10-CM | POA: Diagnosis not present

## 2021-09-04 NOTE — Progress Notes (Unsigned)
[place]  Telephone:(336) [phone] Fax:(336) [phone]  ID: Thomas Gibbs OB: [birth year]  MR#: [id number]  KGY#:[id number]  Patient Care Team: Rusty Aus, MD as PCP - General (Internal Medicine)  CHIEF COMPLAINT: Wegener's granulomatosis.   INTERVAL HISTORY: Patient returns to clinic today for routine 37-monthevaluation and continuation of Ruxience.  He continues to feel well and remains asymptomatic.  He is tolerating his treatment without significant side effects.  He has no neurologic complaints.  He denies any recent fevers or illnesses.  He has a good appetite and denies weight loss.  He has no chest pain, shortness of breath, cough, or hemoptysis.  He denies any nausea, vomiting, constipation, or diarrhea.  He has no urinary complaints.  Patient offers no specific complaints today.  REVIEW OF SYSTEMS:   Review of Systems  Constitutional: Negative.  Negative for fever, malaise/fatigue and weight loss.  Respiratory: Negative.  Negative for cough, hemoptysis and shortness of breath.   Cardiovascular:  Negative for chest pain and leg swelling.  Gastrointestinal: Negative.  Negative for abdominal pain.  Genitourinary: Negative.  Negative for dysuria.  Musculoskeletal: Negative.  Negative for back pain.  Skin: Negative.  Negative for rash.  Neurological: Negative.  Negative for dizziness, focal weakness, weakness and headaches.  Psychiatric/Behavioral: Negative.  The patient is not nervous/anxious.     As per HPI. Otherwise, a complete review of systems is negative.  PAST MEDICAL HISTORY: Past Medical History:  Diagnosis Date   BMI 28.0-28.9,adult    Dysrhythmia    tachycardia    Headache    sinus   Wegener's granulomatosis (granulomatosis with polyangiitis)     PAST SURGICAL HISTORY: Past Surgical History:  Procedure Laterality Date   COLONOSCOPY WITH PROPOFOL N/A 09/16/2020   Procedure: COLONOSCOPY WITH PROPOFOL;  Surgeon: WLucilla Lame MD;   [facility]  Service: Endoscopy;  Laterality: N/A;   ENDOSCOPIC MUCOSAL RESECTION N/A 10/30/2020   Procedure: ENDOSCOPIC MUCOSAL RESECTION;  Surgeon: MRush LandmarkGTelford Nab, MD;  [place]  Service: Gastroenterology;  Laterality: N/A;   ETHMOIDECTOMY Bilateral 11/09/2018   Procedure: Total ETHMOIDECTOMY with sphenoidotomy-Left Total Ethmoidectomy with Sphenoidectomy (Tissue Removal)-Right;  Surgeon: JMargaretha Sheffield MD;  [place]  Service: ENT;  Laterality: Bilateral;   EUS N/A 10/30/2020   Procedure: LOWER ENDOSCOPIC ULTRASOUND (EUS);  Surgeon: MIrving Copas, MD;  [place]  Service: Gastroenterology;  Laterality: N/A;   FLEXIBLE SIGMOIDOSCOPY N/A 09/25/2020   Procedure: FLEXIBLE SIGMOIDOSCOPY;  Surgeon: WLucilla Lame MD;  Location: [facility] ENDOSCOPY;  Service: Endoscopy;  Laterality: N/A;   FLEXIBLE SIGMOIDOSCOPY N/A 10/30/2020   Procedure: FLEXIBLE SIGMOIDOSCOPY;  Surgeon: MRush LandmarkGTelford Nab, MD;  [place]  Service: Gastroenterology;  Laterality: N/A;   FRONTAL SINUS EXPLORATION Bilateral 11/09/2018   Procedure: FRONTAL SINUSotomy;  Surgeon: JMargaretha Sheffield MD;  [place]  Service: ENT;  Laterality: Bilateral;   HEMOSTASIS CLIP PLACEMENT  10/30/2020   Procedure: HEMOSTASIS CLIP PLACEMENT;  Surgeon: MIrving Copas, MD;  [place]  Service: Gastroenterology;;   IMAGE GUIDED SINUS SURGERY Bilateral 11/09/2018   Procedure: IMAGE GUIDED SINUS SURGERY;  Surgeon: JMargaretha Sheffield MD;  [place]  Service: ENT;  Laterality: Bilateral;  NEED STRYKER DISK   LUNG BIOPSY     MAXILLARY ANTROSTOMY Bilateral 11/09/2018   Procedure: MAXILLARY ANTROSTOMY with removal of tissue;  Surgeon: JMargaretha Sheffield MD;  [place]  Service: ENT;  Laterality: Bilateral;   SEPTOPLASTY Bilateral 11/09/2018  Procedure: SEPTOPLASTY;  Surgeon: Margaretha Sheffield, MD;   Location: [place];  Service: ENT;  Laterality: Bilateral;  Gave disk to [place] INJECTION  10/30/2020   Procedure: SUBMUCOSAL LIFTING INJECTION;  Surgeon: Irving Copas., MD;  Location: [place];  Service: Gastroenterology;;   Albin Felling REDUCTION Bilateral 11/09/2018   Procedure: Albin Felling REDUCTION-inferior;  Surgeon: Margaretha Sheffield, MD;  Location: [place];  Service: ENT;  Laterality: Bilateral;   VIDEO BRONCHOSCOPY WITH ENDOBRONCHIAL NAVIGATION N/A 12/22/2018   Procedure: VIDEO BRONCHOSCOPY WITH ENDOBRONCHIAL NAVIGATION;  Surgeon: Ottie Glazier, MD;  Location: [facility];  Service: Thoracic;  Laterality: N/A;   WISDOM TOOTH EXTRACTION      FAMILY HISTORY: History reviewed. No pertinent family history.  ADVANCED DIRECTIVES (Y/N):  N  HEALTH MAINTENANCE: Social History   Tobacco Use   Smoking status: Former    Years: 14.00    Types: Cigarettes    Quit date: 2001    Years since quitting: 22.5   Smokeless tobacco: Former    Types: Chew    Quit date: 12/18/2017  Vaping Use   Vaping Use: Never used  Substance Use Topics   Alcohol use: Yes    Alcohol/week: 3.0 standard drinks of alcohol    Types: 3 Cans of beer per week   Drug use: Never     Colonoscopy:  PAP:  Bone density:  Lipid panel:  No Known Allergies  Current Outpatient Medications  Medication Sig Dispense Refill   Fluticasone Propionate (XHANCE) 93 MCG/ACT EXHU Place 1 spray into both nostrils 2 (two) times daily.     No current facility-administered medications for this visit.    OBJECTIVE: Vitals:   09/08/21 0908  BP: 112/87  Pulse: 67  Resp: 16  Temp: (!) 96.5 F (35.8 C)  SpO2: 98%     Body mass index is 28.27 kg/m.    ECOG FS:0 - Asymptomatic  General: Well-developed, well-nourished, no acute distress. Eyes: Pink conjunctiva, anicteric sclera. HEENT: Normocephalic, moist mucous membranes. Lungs: No audible wheezing or coughing. Heart:  Regular rate and rhythm. Abdomen: Soft, nontender, no obvious distention. Musculoskeletal: No edema, cyanosis, or clubbing. Neuro: Alert, answering all questions appropriately. Cranial nerves grossly intact. Skin: No rashes or petechiae noted. Psych: Normal affect.  LAB RESULTS:  No results found for: "NA", "K", "CL", "CO2", "GLUCOSE", "BUN", "CREATININE", "CALCIUM", "PROT", "ALBUMIN", "AST", "ALT", "ALKPHOS", "BILITOT", "GFRNONAA", "GFRAA"  Lab Results  Component Value Date   WBC 10.2 12/19/2018   HGB 12.4 (L) 12/19/2018   HCT 39.5 12/19/2018   MCV 92.7 12/19/2018   PLT 400 12/19/2018     STUDIES: No results found.  ASSESSMENT: Wegener's granulomatosis.   PLAN:   1. Wegener's granulomatosis: Proceed with 500 mg maintenance IV Ruxience (bio similar to Rituxan) today.  Patient expressed understanding that all laboratory work and any symptoms related to his Wegener's will be monitored and evaluated per rheumatology.  Patient now under the care of Dr. Posey Pronto.  No further intervention is needed.  Return to clinic in 6 months for further evaluation and continuation of treatment if needed.     I spent a total of 30 minutes reviewing chart data, face-to-face evaluation with the patient, counseling and coordination of care as detailed above.    Patient expressed understanding and was in agreement with this plan. He also understands that He can call clinic at any time with any questions, concerns, or complaints.    Thomas Huger, MD   09/08/2021 12:26 PM

## 2021-09-08 ENCOUNTER — Encounter: Payer: Self-pay | Admitting: Oncology

## 2021-09-08 ENCOUNTER — Inpatient Hospital Stay: Payer: 59 | Attending: Oncology | Admitting: Oncology

## 2021-09-08 ENCOUNTER — Inpatient Hospital Stay: Payer: 59

## 2021-09-08 VITALS — BP 112/87 | HR 67 | Temp 96.5°F | Resp 16 | Ht 70.0 in | Wt 197.0 lb

## 2021-09-08 VITALS — BP 125/75 | HR 80

## 2021-09-08 DIAGNOSIS — Z79899 Other long term (current) drug therapy: Secondary | ICD-10-CM | POA: Diagnosis not present

## 2021-09-08 DIAGNOSIS — M3130 Wegener's granulomatosis without renal involvement: Secondary | ICD-10-CM

## 2021-09-08 DIAGNOSIS — Z5112 Encounter for antineoplastic immunotherapy: Secondary | ICD-10-CM | POA: Insufficient documentation

## 2021-09-08 MED ORDER — DIPHENHYDRAMINE HCL 50 MG/ML IJ SOLN
50.0000 mg | Freq: Once | INTRAMUSCULAR | Status: AC
  Administered 2021-09-08: 50 mg via INTRAVENOUS
  Filled 2021-09-08: qty 1

## 2021-09-08 MED ORDER — SODIUM CHLORIDE 0.9 % IV SOLN
Freq: Once | INTRAVENOUS | Status: AC
  Filled 2021-09-08: qty 250

## 2021-09-08 MED ORDER — SODIUM CHLORIDE 0.9 % IV SOLN
500.0000 mg | Freq: Once | INTRAVENOUS | Status: AC
  Administered 2021-09-08: 500 mg via INTRAVENOUS
  Filled 2021-09-08: qty 50

## 2021-09-08 MED ORDER — METHYLPREDNISOLONE SODIUM SUCC 125 MG IJ SOLR
100.0000 mg | Freq: Once | INTRAMUSCULAR | Status: AC
  Administered 2021-09-08: 100 mg via INTRAVENOUS
  Filled 2021-09-08: qty 2

## 2021-09-08 MED ORDER — ACETAMINOPHEN 325 MG PO TABS
650.0000 mg | ORAL_TABLET | Freq: Once | ORAL | Status: AC
  Administered 2021-09-08: 650 mg via ORAL
  Filled 2021-09-08: qty 2

## 2021-11-04 ENCOUNTER — Telehealth: Payer: Self-pay

## 2021-11-04 NOTE — Telephone Encounter (Signed)
-----   Message from Irving Copas., MD sent at 11/04/2021  3:19 PM EDT ----- Regarding: Follow-up Thomas Gibbs, I saw Mr. Cisek today as he is working in the Endo unit working on fixing some issues with our monitors. He wants to go ahead and move forward with his lower EUS for follow-up of the rectal neuroendocrine tumor before the end of the year. Please reach out to him when you have a chance in the next day or early next week and schedule him as able for this year. Thanks. GM

## 2021-11-05 ENCOUNTER — Other Ambulatory Visit: Payer: Self-pay

## 2021-11-05 DIAGNOSIS — D3A026 Benign carcinoid tumor of the rectum: Secondary | ICD-10-CM

## 2021-11-05 DIAGNOSIS — K6289 Other specified diseases of anus and rectum: Secondary | ICD-10-CM

## 2021-11-05 NOTE — Telephone Encounter (Signed)
Lower EUS scheduled on 01/06/22 at 830 am at [facility] with GM   Left message on machine to call back

## 2021-11-06 NOTE — Telephone Encounter (Signed)
Left message on machine to call back  

## 2021-11-09 NOTE — Telephone Encounter (Signed)
EUS scheduled, pt instructed and medications reviewed.  Patient instructions mailed to home and sent to My Chart .  Patient to call with any questions or concerns.  

## 2021-12-08 ENCOUNTER — Other Ambulatory Visit: Payer: Self-pay | Admitting: Orthopedic Surgery

## 2021-12-08 ENCOUNTER — Other Ambulatory Visit (HOSPITAL_COMMUNITY): Payer: Self-pay | Admitting: Orthopedic Surgery

## 2021-12-08 ENCOUNTER — Ambulatory Visit (HOSPITAL_COMMUNITY)
Admission: RE | Admit: 2021-12-08 | Discharge: 2021-12-08 | Disposition: A | Discharge status: Home / Self Care | Payer: 59 | Source: Ambulatory Visit | Attending: Orthopedic Surgery | Admitting: Orthopedic Surgery

## 2021-12-08 DIAGNOSIS — S46112A Strain of muscle, fascia and tendon of long head of biceps, left arm, initial encounter: Secondary | ICD-10-CM | POA: Diagnosis not present

## 2021-12-08 DIAGNOSIS — M25522 Pain in left elbow: Secondary | ICD-10-CM | POA: Diagnosis not present

## 2021-12-08 DIAGNOSIS — S46212A Strain of muscle, fascia and tendon of other parts of biceps, left arm, initial encounter: Secondary | ICD-10-CM | POA: Diagnosis not present

## 2021-12-25 ENCOUNTER — Other Ambulatory Visit (HOSPITAL_COMMUNITY): Payer: Self-pay

## 2021-12-28 ENCOUNTER — Other Ambulatory Visit: Payer: Self-pay

## 2021-12-28 ENCOUNTER — Encounter (HOSPITAL_COMMUNITY): Payer: Self-pay | Admitting: Gastroenterology

## 2021-12-30 DIAGNOSIS — H6982 Other specified disorders of Eustachian tube, left ear: Secondary | ICD-10-CM | POA: Diagnosis not present

## 2021-12-30 DIAGNOSIS — J320 Chronic maxillary sinusitis: Secondary | ICD-10-CM | POA: Diagnosis not present

## 2021-12-30 DIAGNOSIS — M3130 Wegener's granulomatosis without renal involvement: Secondary | ICD-10-CM | POA: Diagnosis not present

## 2022-01-04 DIAGNOSIS — I776 Arteritis, unspecified: Secondary | ICD-10-CM | POA: Diagnosis not present

## 2022-01-04 DIAGNOSIS — Z7960 Long term (current) use of unspecified immunomodulators and immunosuppressants: Secondary | ICD-10-CM | POA: Diagnosis not present

## 2022-01-04 DIAGNOSIS — M3130 Wegener's granulomatosis without renal involvement: Secondary | ICD-10-CM | POA: Diagnosis not present

## 2022-01-06 ENCOUNTER — Other Ambulatory Visit: Payer: Self-pay

## 2022-01-06 ENCOUNTER — Ambulatory Visit (HOSPITAL_COMMUNITY): Payer: 59 | Admitting: Anesthesiology

## 2022-01-06 ENCOUNTER — Encounter (HOSPITAL_COMMUNITY): Payer: Self-pay | Admitting: Gastroenterology

## 2022-01-06 ENCOUNTER — Ambulatory Visit (HOSPITAL_COMMUNITY)
Admission: RE | Admit: 2022-01-06 | Discharge: 2022-01-06 | Disposition: A | Discharge status: Home / Self Care | Payer: 59 | Source: Ambulatory Visit | Attending: Gastroenterology | Admitting: Gastroenterology

## 2022-01-06 ENCOUNTER — Ambulatory Visit (HOSPITAL_BASED_OUTPATIENT_CLINIC_OR_DEPARTMENT_OTHER): Payer: 59 | Admitting: Anesthesiology

## 2022-01-06 ENCOUNTER — Encounter (HOSPITAL_COMMUNITY)
Admission: RE | Disposition: A | Discharge status: Home / Self Care | Payer: Self-pay | Source: Ambulatory Visit | Attending: Gastroenterology

## 2022-01-06 DIAGNOSIS — Z87891 Personal history of nicotine dependence: Secondary | ICD-10-CM | POA: Diagnosis not present

## 2022-01-06 DIAGNOSIS — K641 Second degree hemorrhoids: Secondary | ICD-10-CM

## 2022-01-06 DIAGNOSIS — Z9889 Other specified postprocedural states: Secondary | ICD-10-CM | POA: Diagnosis not present

## 2022-01-06 DIAGNOSIS — K6289 Other specified diseases of anus and rectum: Secondary | ICD-10-CM

## 2022-01-06 DIAGNOSIS — I899 Noninfective disorder of lymphatic vessels and lymph nodes, unspecified: Secondary | ICD-10-CM

## 2022-01-06 DIAGNOSIS — D3A026 Benign carcinoid tumor of the rectum: Secondary | ICD-10-CM

## 2022-01-06 HISTORY — PX: FLEXIBLE SIGMOIDOSCOPY: SHX5431

## 2022-01-06 HISTORY — PX: BIOPSY: SHX5522

## 2022-01-06 HISTORY — PX: EUS: SHX5427

## 2022-01-06 SURGERY — ULTRASOUND, LOWER GI TRACT, ENDOSCOPIC
Anesthesia: Monitor Anesthesia Care

## 2022-01-06 MED ORDER — SODIUM CHLORIDE 0.9 % IV SOLN: INTRAVENOUS | Status: DC

## 2022-01-06 MED ORDER — PROPOFOL 500 MG/50ML IV EMUL
INTRAVENOUS | Status: DC | PRN
  Administered 2022-01-06: 135 ug/kg/min via INTRAVENOUS

## 2022-01-06 MED ORDER — PROPOFOL 10 MG/ML IV BOLUS
INTRAVENOUS | Status: DC | PRN
  Administered 2022-01-06 (×2): 20 mg via INTRAVENOUS
  Administered 2022-01-06: 30 mg via INTRAVENOUS

## 2022-01-06 MED ORDER — PROPOFOL 1000 MG/100ML IV EMUL
INTRAVENOUS | Status: AC
  Filled 2022-01-06: qty 100

## 2022-01-06 MED ORDER — LACTATED RINGERS IV SOLN
INTRAVENOUS | Status: DC
  Administered 2022-01-06: 1000 mL via INTRAVENOUS

## 2022-01-06 NOTE — H&P (Signed)
GASTROENTEROLOGY PROCEDURE H&P NOTE   Primary Care Physician: Rusty Aus, MD  HPI: Thomas Gibbs is a 51 y.o. 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  Past Medical History:  Diagnosis Date   BMI 28.0-28.9,adult    Headache    sinus   Wegener's granulomatosis (granulomatosis with polyangiitis)    Past Surgical History:  Procedure Laterality Date   COLONOSCOPY WITH PROPOFOL N/A 09/16/2020   Procedure: COLONOSCOPY WITH PROPOFOL;  Surgeon: Lucilla Lame, MD;  [facility] ENDOSCOPY;  Service: Endoscopy;  Laterality: N/A;   ENDOSCOPIC MUCOSAL RESECTION N/A 10/30/2020   Procedure: ENDOSCOPIC MUCOSAL RESECTION;  Surgeon: Rush Landmark Telford Nab., MD;  Location: [place];  Service: Gastroenterology;  Laterality: N/A;   ETHMOIDECTOMY Bilateral 11/09/2018   Procedure: Total ETHMOIDECTOMY with sphenoidotomy-Left Total Ethmoidectomy with Sphenoidectomy (Tissue Removal)-Right;  Surgeon: Margaretha Sheffield, MD;  Location: [place];  Service: ENT;  Laterality: Bilateral;   EUS N/A 10/30/2020   Procedure: LOWER ENDOSCOPIC ULTRASOUND (EUS);  Surgeon: Irving Copas., MD;  Location: [place];  Service: Gastroenterology;  Laterality: N/A;   FLEXIBLE SIGMOIDOSCOPY N/A 09/25/2020   Procedure: FLEXIBLE SIGMOIDOSCOPY;  Surgeon: Lucilla Lame, MD;  Location: [facility] ENDOSCOPY;  Service: Endoscopy;  Laterality: N/A;   FLEXIBLE SIGMOIDOSCOPY N/A 10/30/2020   Procedure: FLEXIBLE SIGMOIDOSCOPY;  Surgeon: Rush Landmark Telford Nab., MD;  Location: [place];  Service: Gastroenterology;  Laterality: N/A;   FRONTAL SINUS EXPLORATION Bilateral 11/09/2018   Procedure: FRONTAL SINUSotomy;  Surgeon: Margaretha Sheffield, MD;  Location: [place];  Service: ENT;  Laterality: Bilateral;   HEMOSTASIS CLIP PLACEMENT  10/30/2020   Procedure: HEMOSTASIS CLIP PLACEMENT;  Surgeon: Irving Copas.,  MD;  Location: [place];  Service: Gastroenterology;;   IMAGE GUIDED SINUS SURGERY Bilateral 11/09/2018   Procedure: IMAGE GUIDED SINUS SURGERY;  Surgeon: Margaretha Sheffield, MD;  Location: [place];  Service: ENT;  Laterality: Bilateral;  NEED STRYKER DISK   LUNG BIOPSY     MAXILLARY ANTROSTOMY Bilateral 11/09/2018   Procedure: MAXILLARY ANTROSTOMY with removal of tissue;  Surgeon: Margaretha Sheffield, MD;  Location: [place];  Service: ENT;  Laterality: Bilateral;   SEPTOPLASTY Bilateral 11/09/2018   Procedure: SEPTOPLASTY;  Surgeon: Margaretha Sheffield, MD;  Location: [place];  Service: ENT;  Laterality: Bilateral;  Gave disk to [place] INJECTION  10/30/2020   Procedure: SUBMUCOSAL LIFTING INJECTION;  Surgeon: Irving Copas., MD;  Location: [place];  Service: Gastroenterology;;   Albin Felling REDUCTION Bilateral 11/09/2018   Procedure: Albin F[place] REDUCTION-inferior;  Surgeon: Margaretha Sheffield, MD;  Location: [place];  Service: ENT;  Laterality: Bilateral;   VIDEO BRONCHOSCOPY WITH ENDOBRONCHIAL NAVIGATION N/A 12/22/2018   Procedure: VIDEO BRONCHOSCOPY WITH ENDOBRONCHIAL NAVIGATION;  Surgeon: Ottie Glazier, MD;  Location: [facility];  Service: Thoracic;  Laterality: N/A;   WISDOM TOOTH EXTRACTION     Current Facility-Administered Medications  Medication Dose Route Frequency Provider Last Rate Last Admin   0.9 %  sodium chloride infusion   Intravenous Continuous Mansouraty, Telford Nab., MD       lactated ringers infusion   Intravenous Continuous Mansouraty, Telford Nab., MD 10 mL/hr at 01/06/22 0740 1,000 mL at 01/06/22 0740    Current Facility-Administered Medications:    0.9 %  sodium chloride infusion, , Intravenous, Continuous, Mansouraty, Telford Nab., MD   lactated ringers infusion, , Intravenous, Continuous, Mansouraty, Telford Nab., MD, Last Rate: 10 mL/hr at 01/06/22  0740, 1,000 mL at 01/06/22 0740 No Known Allergies History  reviewed. No pertinent family history. Social History   Socioeconomic History   Marital status: Married    Spouse name: Not on file   Number of children: Not on file   Years of education: Not on file   Highest education level: Not on file  Occupational History   Not on file  Tobacco Use   Smoking status: Former    Years: 14.00    Types: Cigarettes    Quit date: 2001    Years since quitting: 22.9   Smokeless tobacco: Former    Types: Chew    Quit date: 12/18/2017  Vaping Use   Vaping Use: Never used  Substance and Sexual Activity   Alcohol use: Not Currently    Alcohol/week: 3.0 standard drinks of alcohol    Types: 3 Cans of beer per week    Comment: social   Drug use: Never   Sexual activity: Yes  Other Topics Concern   Not on file  Social History Narrative   Not on file   Social Determinants of Health   Financial Resource Strain: Not on file  Food Insecurity: Not on file  Transportation Needs: Not on file  Physical Activity: Not on file  Stress: Not on file  Social Connections: Not on file  Intimate Partner Violence: Not on file    Physical Exam: Today's Vitals   01/06/22 0731  BP: 128/79  Pulse: 78  Resp: 10  Temp: 99 F (37.2 C)  TempSrc: Tympanic  SpO2: 98%  Weight: 89.4 kg  Height: '5\' 10"'$  (1.778 m)  PainSc: 0-No pain   Body mass index is 28.28 kg/m. GEN: NAD EYE: Sclerae anicteric ENT: MMM CV: Non-tachycardic GI: Soft, NT/ND NEURO:  Alert & Oriented x 3  Lab Results: No results for input(s): "WBC", "HGB", "HCT", "PLT" in the last 72 hours. BMET No results for input(s): "NA", "K", "CL", "CO2", "GLUCOSE", "BUN", "CREATININE", "CALCIUM" in the last 72 hours. LFT No results for input(s): "PROT", "ALBUMIN", "AST", "ALT", "ALKPHOS", "BILITOT", "BILIDIR", "IBILI" in the last 72 hours. PT/INR No results for input(s): "LABPROT", "INR" in the last 72 hours.   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  The risks and benefits of endoscopic evaluation/treatment were discussed with the patient and/or family; these include but are not limited to the risk of perforation, infection, bleeding, missed lesions, lack of diagnosis, severe illness requiring hospitalization, as well as anesthesia and sedation related illnesses.  The patient's history has been reviewed, patient examined, no change in status, and deemed stable for procedure.  The patient and/or family is agreeable to proceed.    Justice Britain, MD [facility] Office # [phone]

## 2022-01-06 NOTE — Anesthesia Procedure Notes (Signed)
Procedure Name: MAC Date/Time: 01/06/2022 8:56 AM  Performed by: Niel Hummer, CRNAPre-anesthesia Checklist: Patient identified, Emergency Drugs available, Patient being monitored and Suction available Oxygen Delivery Method: Simple face mask

## 2022-01-06 NOTE — Anesthesia Postprocedure Evaluation (Signed)
Anesthesia Post Note  Patient: Thomas Gibbs  Procedure(s) Performed: LOWER ENDOSCOPIC ULTRASOUND (EUS) FLEXIBLE SIGMOIDOSCOPY BIOPSY     Patient location during evaluation: PACU Anesthesia Type: MAC Level of consciousness: awake and alert Pain management: pain level controlled Vital Signs Assessment: post-procedure vital signs reviewed and stable Respiratory status: spontaneous breathing, nonlabored ventilation, respiratory function stable and patient connected to nasal cannula oxygen Cardiovascular status: stable and blood pressure returned to baseline Postop Assessment: no apparent nausea or vomiting Anesthetic complications: no   No notable events documented.  Last Vitals:  Vitals:   01/06/22 1000 01/06/22 1010  BP: 108/77 108/78  Pulse: 82 73  Resp: 17 13  Temp:    SpO2: 95% 94%    Last Pain:  Vitals:   01/06/22 1010  TempSrc:   PainSc: 0-No pain                 Tiajuana Amass

## 2022-01-06 NOTE — Discharge Instructions (Signed)
YOU HAD AN ENDOSCOPIC PROCEDURE TODAY: Refer to the procedure report and other information in the discharge instructions given to you for any specific questions about what was found during the examination. If this information does not answer your questions, please call [facility] office at [phone] to clarify.  ° °YOU SHOULD EXPECT: Some feelings of bloating in the abdomen. Passage of more gas than usual. Walking can help get rid of the air that was put into your GI tract during the procedure and reduce the bloating. If you had a lower endoscopy (such as a colonoscopy or flexible sigmoidoscopy) you may notice spotting of blood in your stool or on the toilet paper. Some abdominal soreness may be present for a day or two, also. ° °DIET: Your first meal following the procedure should be a light meal and then it is ok to progress to your normal diet. A half-sandwich or bowl of soup is an example of a good first meal. Heavy or fried foods are harder to digest and may make you feel nauseous or bloated. Drink plenty of fluids but you should avoid alcoholic beverages for 24 hours. If you had a esophageal dilation, please see attached instructions for diet.   ° °ACTIVITY: Your care partner should take you home directly after the procedure. You should plan to take it easy, moving slowly for the rest of the day. You can resume normal activity the day after the procedure however YOU SHOULD NOT DRIVE, use power tools, machinery or perform tasks that involve climbing or major physical exertion for 24 hours (because of the sedation medicines used during the test).  ° °SYMPTOMS TO REPORT IMMEDIATELY: °A gastroenterologist can be reached at any hour. Please call [phone]  for any of the following symptoms:  °Following lower endoscopy (colonoscopy, flexible sigmoidoscopy) °Excessive amounts of blood in the stool  °Significant tenderness, worsening of abdominal pains  °Swelling of the abdomen that is new, acute  °Fever of 100° or  higher  °Following upper endoscopy (EGD, EUS, ERCP, esophageal dilation) °Vomiting of blood or coffee ground material  °New, significant abdominal pain  °New, significant chest pain or pain under the shoulder blades  °Painful or persistently difficult swallowing  °New shortness of breath  °Black, tarry-looking or red, bloody stools ° °FOLLOW UP:  °If any biopsies were taken you will be contacted by phone or by letter within the next 1-3 weeks. Call [phone]  if you have not heard about the biopsies in 3 weeks.  °Please also call with any specific questions about appointments or follow up tests. ° °

## 2022-01-06 NOTE — Anesthesia Preprocedure Evaluation (Signed)
Anesthesia Evaluation  Patient identified by MRN, date of birth, ID band Patient awake    Reviewed: Allergy & Precautions, NPO status , Patient's Chart, lab work & pertinent test results  Airway Mallampati: II  TM Distance: >3 FB Neck ROM: Full    Dental  (+) Dental Advisory Given   Pulmonary former smoker   breath sounds clear to auscultation       Cardiovascular negative cardio ROS  Rhythm:Regular Rate:Normal     Neuro/Psych negative neurological ROS     GI/Hepatic Neg liver ROS,,,Rectal carcinoid    Endo/Other  negative endocrine ROS    Renal/GU negative Renal ROS     Musculoskeletal   Abdominal   Peds  Hematology negative hematology ROS (+)   Anesthesia Other Findings   Reproductive/Obstetrics                              Anesthesia Physical Anesthesia Plan  ASA: 2  Anesthesia Plan: MAC   Post-op Pain Management:    Induction:   PONV Risk Score and Plan: 1 and Propofol infusion and Ondansetron  Airway Management Planned: Natural Airway and Simple Face Mask  Additional Equipment:   Intra-op Plan:   Post-operative Plan:   Informed Consent: I have reviewed the patients History and Physical, chart, labs and discussed the procedure including the risks, benefits and alternatives for the proposed anesthesia with the patient or authorized representative who has indicated his/her understanding and acceptance.       Plan Discussed with:   Anesthesia Plan Comments:          Anesthesia Quick Evaluation

## 2022-01-06 NOTE — Transfer of Care (Signed)
Immediate Anesthesia Transfer of Care Note  Patient: Thomas Gibbs  Procedure(s) Performed: LOWER ENDOSCOPIC ULTRASOUND (EUS) FLEXIBLE SIGMOIDOSCOPY BIOPSY  Patient Location: PACU  Anesthesia Type:MAC  Level of Consciousness: drowsy  Airway & Oxygen Therapy: Patient Spontanous Breathing and Patient connected to face mask oxygen  Post-op Assessment: Report given to RN, Post -op Vital signs reviewed and stable, and Patient moving all extremities X 4  Post vital signs: Reviewed and stable  Last Vitals:  Vitals Value Taken Time  BP 96/66 01/06/22 0940  Temp    Pulse 78 01/06/22 0941  Resp 16 01/06/22 0941  SpO2 100 % 01/06/22 0941  Vitals shown include unvalidated device data.  Last Pain:  Vitals:   01/06/22 0731  TempSrc: Tympanic  PainSc: 0-No pain         Complications: No notable events documented.

## 2022-01-06 NOTE — Op Note (Signed)
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 Estimated Blood Loss:     Estimated blood loss was minimal. Procedure:                Pre-Anesthesia Assessment:                           - Prior to the procedure, a History and Physical                            was performed, and patient medications and                            allergies were reviewed. The patient's tolerance of                            previous anesthesia was also reviewed. The risks                            and benefits of the procedure and the sedation                            options and risks were discussed with the patient.                            All questions were answered, and informed consent                            was obtained. Prior Anticoagulants: The patient has                            taken no anticoagulant or antiplatelet agents. ASA  Grade Assessment: II - A patient with mild systemic                            disease. After reviewing the risks and benefits,                            the patient was deemed in satisfactory condition to                            undergo the procedure.                           After obtaining informed consent, the endoscope was                             passed under direct vision. Throughout the                            procedure, the patient's blood pressure, pulse, and                            oxygen saturations were monitored continuously. The                            [id number] ([id number]) Olympus therapeutic endoscope                            was introduced through the anus and advanced to the                            the transverse colon for ultrasound. The                            [id number] ([id number]) Olympus radial ultrasound                            scope was introduced through the anus and advanced                            to the the sigmoid colon for ultrasound. The lower                            EUS was accomplished without difficulty. The                            patient tolerated the procedure. The quality of the                            bowel preparation was adequate. Scope In: 9:08:15 AM Scope Out: 9:37:27 AM Total Procedure Duration: 0 hours 29 minutes 12 seconds  Findings:      The digital rectal exam findings include hemorrhoids. Pertinent       negatives include no palpable rectal lesions.      ENDOSCOPIC FINDING: :  Normal mucosa was found in the recto-sigmoid colon, in the sigmoid colon       and in the descending colon.      A medium post mucosectomy scar was found in the mid rectum. There were 2       areas of granularity noted on the scar itself - likely from previous       clips and healing. After the rest of the EUS was completed, biopsies       were taken with a cold forceps for histology to remove those 2 areas.      Non-bleeding non-thrombosed external and internal hemorrhoids were found       during retroflexion, during perianal exam and during digital exam. The       hemorrhoids were Grade II (internal hemorrhoids that prolapse but reduce       spontaneously).      ENDOSONOGRAPHIC FINDING: :      Local wall thickening was found in the rectum at the area of the  scar.       This finding appeared to be primarily due to increased thickness of the       luminal interface/superficial mucosa (Layer 1) and deep mucosa (Layer       2). The rectal wall measured up to 2.8 mm in thickness.      The rectum was otherwise normal in appearance.      The perirectal space was normal.      No malignant-appearing lymph nodes were visualized in the perirectal       region and in the left iliac region.      The internal anal sphincter was visualized endosonographically and       appeared normal. Impression:               FLEX Impression:                           - Hemorrhoids found on digital rectal exam.                           - Normal mucosa in the recto-sigmoid colon, in the                            sigmoid colon and in the descending colon.                           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 Biopsied.                           - Non-bleeding non-thrombosed external and internal                            hemorrhoids.                           EUS Impression:                           - Local wall  thickening was visualized                            endosonographically in the rectum at the area of                            scar site. This was at the luminal                            interface/superficial mucosa (Layer 1) and deep                            mucosa (Layer 2). The rest of the endosonographic                            images of the rectum were unremarkable.                           - Endosonographic images of the perirectal space                            were unremarkable.                           - No malignant-appearing lymph nodes were                            visualized endosonographically in the perirectal                            region and in the left iliac region.                           - The internal anal sphincter was visualized                             endosonographically and appeared normal. Moderate Sedation:      Not Applicable - Patient had care per Anesthesia. Recommendation:           - The patient will be observed post-procedure,                            until all discharge criteria are met.                           - Discharge patient to home.                           - Patient has a contact number available for                            emergencies. The signs and symptoms of potential                            delayed complications were discussed with the  patient. Return to normal activities tomorrow.                            Written discharge instructions were provided to the                            patient.                           - Resume previous diet.                           - Await path results.                           - Repeat lower endoscopic ultrasound for                            surveillance. If negative for recurrence, then                            2-year Lower EUS will be recommended.                           - The findings and recommendations were discussed                            with the patient.                           - The findings and recommendations were discussed                            with the patient's family. Procedure Code(s):        --- Professional ---                           [phone], Colonoscopy, flexible; with endoscopic                            ultrasound examination limited to the rectum,                            sigmoid, descending, transverse, or ascending colon                            and cecum, and adjacent structures                           45380, Colonoscopy, flexible; with biopsy, single                            or multiple Diagnosis Code(s):        --- Professional ---                           K64.1, Second degree hemorrhoids  M54.650, Other specified postprocedural states                            K62.89, Other specified diseases of anus and rectum                           I89.9, Noninfective disorder of lymphatic vessels                            and lymph nodes, unspecified CPT copyright 2022 [employer]. All rights reserved. The codes documented in this report are preliminary and upon coder review may  be revised to meet current compliance requirements. Justice Britain, MD 01/06/2022 9:48:16 AM Number of Addenda: 0

## 2022-01-07 ENCOUNTER — Encounter: Payer: Self-pay | Admitting: Gastroenterology

## 2022-01-07 ENCOUNTER — Other Ambulatory Visit (HOSPITAL_COMMUNITY): Payer: Self-pay

## 2022-01-07 LAB — SURGICAL PATHOLOGY

## 2022-01-07 MED ORDER — AZITHROMYCIN 250 MG PO TABS
ORAL_TABLET | ORAL | 0 refills | Status: DC
  Filled 2022-01-07: qty 6, 5d supply, fill #0

## 2022-01-07 MED ORDER — PREDNISONE 10 MG PO TABS
10.0000 mg | ORAL_TABLET | Freq: Every day | ORAL | 0 refills | Status: AC
  Filled 2022-01-07: qty 10, 10d supply, fill #0

## 2022-01-08 ENCOUNTER — Encounter (HOSPITAL_COMMUNITY): Payer: Self-pay | Admitting: Gastroenterology

## 2022-01-13 ENCOUNTER — Other Ambulatory Visit (HOSPITAL_COMMUNITY): Payer: Self-pay

## 2022-01-13 DIAGNOSIS — I776 Arteritis, unspecified: Secondary | ICD-10-CM | POA: Diagnosis not present

## 2022-01-13 DIAGNOSIS — M3130 Wegener's granulomatosis without renal involvement: Secondary | ICD-10-CM | POA: Diagnosis not present

## 2022-01-13 MED ORDER — AZITHROMYCIN 250 MG PO TABS
ORAL_TABLET | ORAL | 0 refills | Status: DC
  Filled 2022-01-13: qty 6, 5d supply, fill #0

## 2022-01-14 DIAGNOSIS — L821 Other seborrheic keratosis: Secondary | ICD-10-CM | POA: Diagnosis not present

## 2022-01-14 DIAGNOSIS — L820 Inflamed seborrheic keratosis: Secondary | ICD-10-CM | POA: Diagnosis not present

## 2022-01-14 DIAGNOSIS — L538 Other specified erythematous conditions: Secondary | ICD-10-CM | POA: Diagnosis not present

## 2022-01-14 DIAGNOSIS — D225 Melanocytic nevi of trunk: Secondary | ICD-10-CM | POA: Diagnosis not present

## 2022-01-14 DIAGNOSIS — L814 Other melanin hyperpigmentation: Secondary | ICD-10-CM | POA: Diagnosis not present

## 2022-01-14 DIAGNOSIS — L570 Actinic keratosis: Secondary | ICD-10-CM | POA: Diagnosis not present

## 2022-01-15 ENCOUNTER — Other Ambulatory Visit (HOSPITAL_COMMUNITY): Payer: Self-pay

## 2022-01-21 ENCOUNTER — Encounter: Payer: Self-pay | Admitting: Oncology

## 2022-01-23 ENCOUNTER — Encounter: Payer: Self-pay | Admitting: Oncology

## 2022-02-23 ENCOUNTER — Other Ambulatory Visit (HOSPITAL_COMMUNITY): Payer: Self-pay

## 2022-02-23 MED ORDER — METHYLPREDNISOLONE 4 MG PO TBPK
ORAL_TABLET | ORAL | 0 refills | Status: AC
  Filled 2022-02-23: qty 21, 6d supply, fill #0

## 2022-03-04 ENCOUNTER — Encounter: Payer: Self-pay | Admitting: Oncology

## 2022-03-05 ENCOUNTER — Other Ambulatory Visit (HOSPITAL_COMMUNITY): Payer: Self-pay

## 2022-03-05 MED ORDER — AZITHROMYCIN 250 MG PO TABS
ORAL_TABLET | ORAL | 0 refills | Status: DC
  Filled 2022-03-05: qty 6, 5d supply, fill #0

## 2022-03-11 ENCOUNTER — Ambulatory Visit: Payer: 59 | Admitting: Nurse Practitioner

## 2022-03-11 ENCOUNTER — Ambulatory Visit: Payer: Self-pay

## 2022-03-11 ENCOUNTER — Ambulatory Visit: Payer: 59

## 2022-03-12 DIAGNOSIS — Z0000 Encounter for general adult medical examination without abnormal findings: Secondary | ICD-10-CM | POA: Diagnosis not present

## 2022-03-12 DIAGNOSIS — Z125 Encounter for screening for malignant neoplasm of prostate: Secondary | ICD-10-CM | POA: Diagnosis not present

## 2022-03-15 ENCOUNTER — Inpatient Hospital Stay: Payer: 59 | Attending: Nurse Practitioner | Admitting: Nurse Practitioner

## 2022-03-15 ENCOUNTER — Inpatient Hospital Stay: Payer: 59

## 2022-03-15 ENCOUNTER — Encounter: Payer: Self-pay | Admitting: Nurse Practitioner

## 2022-03-15 VITALS — BP 112/85 | HR 57 | Temp 97.9°F | Wt 200.0 lb

## 2022-03-15 VITALS — BP 111/71 | HR 69 | Temp 96.6°F | Resp 18

## 2022-03-15 DIAGNOSIS — Z87891 Personal history of nicotine dependence: Secondary | ICD-10-CM | POA: Insufficient documentation

## 2022-03-15 DIAGNOSIS — Z79899 Other long term (current) drug therapy: Secondary | ICD-10-CM | POA: Insufficient documentation

## 2022-03-15 DIAGNOSIS — M3130 Wegener's granulomatosis without renal involvement: Secondary | ICD-10-CM

## 2022-03-15 DIAGNOSIS — Z79620 Long term (current) use of immunosuppressive biologic: Secondary | ICD-10-CM

## 2022-03-15 DIAGNOSIS — Z5112 Encounter for antineoplastic immunotherapy: Secondary | ICD-10-CM | POA: Diagnosis not present

## 2022-03-15 MED ORDER — METHYLPREDNISOLONE SODIUM SUCC 125 MG IJ SOLR
100.0000 mg | Freq: Once | INTRAMUSCULAR | Status: AC
  Administered 2022-03-15: 100 mg via INTRAVENOUS
  Filled 2022-03-15: qty 2

## 2022-03-15 MED ORDER — SODIUM CHLORIDE 0.9 % IV SOLN
500.0000 mg | Freq: Once | INTRAVENOUS | Status: AC
  Administered 2022-03-15: 500 mg via INTRAVENOUS
  Filled 2022-03-15: qty 50

## 2022-03-15 MED ORDER — ACETAMINOPHEN 325 MG PO TABS
650.0000 mg | ORAL_TABLET | Freq: Once | ORAL | Status: AC
  Administered 2022-03-15: 650 mg via ORAL
  Filled 2022-03-15: qty 2

## 2022-03-15 MED ORDER — DIPHENHYDRAMINE HCL 50 MG/ML IJ SOLN
50.0000 mg | Freq: Once | INTRAMUSCULAR | Status: AC
  Administered 2022-03-15: 50 mg via INTRAVENOUS
  Filled 2022-03-15: qty 1

## 2022-03-15 MED ORDER — SODIUM CHLORIDE 0.9 % IV SOLN
Freq: Once | INTRAVENOUS | Status: AC
  Filled 2022-03-15: qty 250

## 2022-03-15 NOTE — Patient Instructions (Signed)
Rituximab Injection What is this medication? RITUXIMAB (ri TUX i mab) treats leukemia and lymphoma. It works by blocking a protein that causes cancer cells to grow and multiply. This helps to slow or stop the spread of cancer cells. It may also be used to treat autoimmune conditions, such as arthritis. It works by slowing down an overactive immune system. It is a monoclonal antibody. This medicine may be used for other purposes; ask your health care provider or pharmacist if you have questions. COMMON BRAND NAME(S): RIABNI, Rituxan, RUXIENCE, truxima What should I tell my care team before I take this medication? They need to know if you have any of these conditions: Chest pain Heart disease Immune system problems Infection, such as chickenpox, cold sores, hepatitis B, herpes Irregular heartbeat or rhythm Kidney disease Low blood counts, such as low white cells, platelets, red cells Lung disease Recent or upcoming vaccine An unusual or allergic reaction to rituximab, other medications, foods, dyes, or preservatives Pregnant or trying to get pregnant Breast-feeding How should I use this medication? This medication is injected into a vein. It is given by a care team in a hospital or clinic setting. A special MedGuide will be given to you before each treatment. Be sure to read this information carefully each time. Talk to your care team about the use of this medication in children. While this medication may be prescribed for children as young as 6 months for selected conditions, precautions do apply. Overdosage: If you think you have taken too much of this medicine contact a poison control center or emergency room at once. NOTE: This medicine is only for you. Do not share this medicine with others. What if I miss a dose? Keep appointments for follow-up doses. It is important not to miss your dose. Call your care team if you are unable to keep an appointment. What may interact with this  medication? Do not take this medication with any of the following: Live vaccines This medication may also interact with the following: Cisplatin This list may not describe all possible interactions. Give your health care provider a list of all the medicines, herbs, non-prescription drugs, or dietary supplements you use. Also tell them if you smoke, drink alcohol, or use illegal drugs. Some items may interact with your medicine. What should I watch for while using this medication? Your condition will be monitored carefully while you are receiving this medication. You may need blood work while taking this medication. This medication can cause serious infusion reactions. To reduce the risk your care team may give you other medications to take before receiving this one. Be sure to follow the directions from your care team. This medication may increase your risk of getting an infection. Call your care team for advice if you get a fever, chills, sore throat, or other symptoms of a cold or flu. Do not treat yourself. Try to avoid being around people who are sick. Call your care team if you are around anyone with measles, chickenpox, or if you develop sores or blisters that do not heal properly. Avoid taking medications that contain aspirin, acetaminophen, ibuprofen, naproxen, or ketoprofen unless instructed by your care team. These medications may hide a fever. This medication may cause serious skin reactions. They can happen weeks to months after starting the medication. Contact your care team right away if you notice fevers or flu-like symptoms with a rash. The rash may be red or purple and then turn into blisters or peeling of the skin.   You may also notice a red rash with swelling of the face, lips, or lymph nodes in your neck or under your arms. In some patients, this medication may cause a serious brain infection that may cause death. If you have any problems seeing, thinking, speaking, walking, or  standing, tell your care team right away. If you cannot reach your care team, urgently seek another source of medical care. Talk to your care team if you may be pregnant. Serious birth defects can occur if you take this medication during pregnancy and for 12 months after the last dose. You will need a negative pregnancy test before starting this medication. Contraception is recommended while taking this medication and for 12 months after the last dose. Your care team can help you find the option that works for you. Do not breastfeed while taking this medication and for at least 6 months after the last dose. What side effects may I notice from receiving this medication? Side effects that you should report to your care team as soon as possible: Allergic reactions or angioedema--skin rash, itching or hives, swelling of the face, eyes, lips, tongue, arms, or legs, trouble swallowing or breathing Bowel blockage--stomach cramping, unable to have a bowel movement or pass gas, loss of appetite, vomiting Dizziness, loss of balance or coordination, confusion or trouble speaking Heart attack--pain or tightness in the chest, shoulders, arms, or jaw, nausea, shortness of breath, cold or clammy skin, feeling faint or lightheaded Heart rhythm changes--fast or irregular heartbeat, dizziness, feeling faint or lightheaded, chest pain, trouble breathing Infection--fever, chills, cough, sore throat, wounds that don't heal, pain or trouble when passing urine, general feeling of discomfort or being unwell Infusion reactions--chest pain, shortness of breath or trouble breathing, feeling faint or lightheaded Kidney injury--decrease in the amount of urine, swelling of the ankles, hands, or feet Liver injury--right upper belly pain, loss of appetite, nausea, light-colored stool, dark yellow or brown urine, yellowing skin or eyes, unusual weakness or fatigue Redness, blistering, peeling, or loosening of the skin, including  inside the mouth Stomach pain that is severe, does not go away, or gets worse Tumor lysis syndrome (TLS)--nausea, vomiting, diarrhea, decrease in the amount of urine, dark urine, unusual weakness or fatigue, confusion, muscle pain or cramps, fast or irregular heartbeat, joint pain Side effects that usually do not require medical attention (report to your care team if they continue or are bothersome): Headache Joint pain Nausea Runny or stuffy nose Unusual weakness or fatigue This list may not describe all possible side effects. Call your doctor for medical advice about side effects. You may report side effects to FDA at 1-800-FDA-1088. Where should I keep my medication? This medication is given in a hospital or clinic. It will not be stored at home. NOTE: This sheet is a summary. It may not cover all possible information. If you have questions about this medicine, talk to your doctor, pharmacist, or health care provider.  2023 Elsevier/Gold Standard (2021-06-09 00:00:00)  

## 2022-03-15 NOTE — Progress Notes (Signed)
[place]  [phone]) [phone] [phone]) [phone]  ID: Jory Ee OB: 1970-12-19  MR#: [id number]  KLK#:[id number]  Patient Care Team: Rusty Aus, MD as PCP - General (Internal Medicine)  CHIEF COMPLAINT: Wegener's granulomatosis.   INTERVAL HISTORY: Patient returns to clinic today for routine 28-monthevaluation and continuation of Ruxience. He continues to feel well. Did have respiratory infection over December and was treated with antibiotics steroids then another round of antibiotics. No shortness of breath. Does feel some calf pain prior to his upcoming infusions which resolves post infusion. He tolerates ruxience well without significant side effects. No neurological complaints. No hx of infusion reaction. No specific complaints today.   REVIEW OF SYSTEMS:   Review of Systems  Constitutional: Negative.  Negative for fever, malaise/fatigue and weight loss.  Respiratory: Negative.  Negative for cough, hemoptysis and shortness of breath.   Cardiovascular:  Negative for chest pain and leg swelling.  Gastrointestinal: Negative.  Negative for abdominal pain.  Genitourinary: Negative.  Negative for dysuria.  Musculoskeletal: Negative.  Negative for back pain.  Skin: Negative.  Negative for rash.  Neurological: Negative.  Negative for dizziness, focal weakness, weakness and headaches.  Psychiatric/Behavioral: Negative.  The patient is not nervous/anxious.   As per HPI. Otherwise, a complete review of systems is negative.  PAST MEDICAL HISTORY: Past Medical History:  Diagnosis Date   BMI 28.0-28.9,adult    Headache    sinus   Wegener's granulomatosis (granulomatosis with polyangiitis)     PAST SURGICAL HISTORY: Past Surgical History:  Procedure Laterality Date   BIOPSY  01/06/2022   Procedure: BIOPSY;  Surgeon: MRush Landmark GTelford Nab, MD;  [facility];  Service: Gastroenterology;;   COLONOSCOPY WITH PROPOFOL N/A 09/16/2020    Procedure: COLONOSCOPY WITH PROPOFOL;  Surgeon: WLucilla Lame MD;  Location: [facility];  Service: Endoscopy;  Laterality: N/A;   ENDOSCOPIC MUCOSAL RESECTION N/A 10/30/2020   Procedure: ENDOSCOPIC MUCOSAL RESECTION;  Surgeon: MRush LandmarkGTelford Nab, MD;  [place]  Service: Gastroenterology;  Laterality: N/A;   ETHMOIDECTOMY Bilateral 11/09/2018   Procedure: Total ETHMOIDECTOMY with sphenoidotomy-Left Total Ethmoidectomy with Sphenoidectomy (Tissue Removal)-Right;  Surgeon: JMargaretha Sheffield MD;  [place]  Service: ENT;  Laterality: Bilateral;   EUS N/A 10/30/2020   Procedure: LOWER ENDOSCOPIC ULTRASOUND (EUS);  Surgeon: MIrving Copas, MD;  [place] [facility]  Service: Gastroenterology;  Laterality: N/A;   EUS N/A 01/06/2022   Procedure: LOWER ENDOSCOPIC ULTRASOUND (EUS);  Surgeon: MIrving Copas, MD;  [facility];  Service: Gastroenterology;  Laterality: N/A;   FLEXIBLE SIGMOIDOSCOPY N/A 09/25/2020   Procedure: FLEXIBLE SIGMOIDOSCOPY;  Surgeon: WLucilla Lame MD;  Location: [facility];  Service: Endoscopy;  Laterality: N/A;   FLEXIBLE SIGMOIDOSCOPY N/A 10/30/2020   Procedure: FLEXIBLE SIGMOIDOSCOPY;  Surgeon: MRush LandmarkGTelford Nab, MD;  [place] [facility]  Service: Gastroenterology;  Laterality: N/A;   FLEXIBLE SIGMOIDOSCOPY N/A 01/06/2022   Procedure: FLEXIBLE SIGMOIDOSCOPY;  Surgeon: MRush LandmarkGTelford Nab, MD;  [facility];  Service: Gastroenterology;  Laterality: N/A;   FRONTAL SINUS EXPLORATION Bilateral 11/09/2018   Procedure: FRONTAL SINUSotomy;  Surgeon: JMargaretha Sheffield MD;  [place]  Service: ENT;  Laterality: Bilateral;   HEMOSTASIS CLIP PLACEMENT  10/30/2020   Procedure: HEMOSTASIS CLIP PLACEMENT;  Surgeon: MIrving Copas, MD;  [place]  Service: Gastroenterology;;   IMAGE GUIDED SINUS SURGERY Bilateral 11/09/2018   Procedure: IMAGE GUIDED SINUS SURGERY;   Surgeon: JMargaretha Sheffield MD;  [place]  Service:  ENT;  Laterality: Bilateral;  NEED STRYKER DISK   LUNG BIOPSY     MAXILLARY ANTROSTOMY Bilateral 11/09/2018   Procedure: MAXILLARY ANTROSTOMY with removal of tissue;  Surgeon: Margaretha Sheffield, MD;  Location: [place];  Service: ENT;  Laterality: Bilateral;   SEPTOPLASTY Bilateral 11/09/2018   Procedure: SEPTOPLASTY;  Surgeon: Margaretha Sheffield, MD;  Location: [place];  Service: ENT;  Laterality: Bilateral;  Gave disk to [place] INJECTION  10/30/2020   Procedure: SUBMUCOSAL LIFTING INJECTION;  Surgeon: Irving Copas., MD;  Location: [place];  Service: Gastroenterology;;   Albin Felling REDUCTION Bilateral 11/09/2018   Procedure: Albin Felling REDUCTION-inferior;  Surgeon: Margaretha Sheffield, MD;  Location: [place];  Service: ENT;  Laterality: Bilateral;   VIDEO BRONCHOSCOPY WITH ENDOBRONCHIAL NAVIGATION N/A 12/22/2018   Procedure: VIDEO BRONCHOSCOPY WITH ENDOBRONCHIAL NAVIGATION;  Surgeon: Ottie Glazier, MD;  Location: [facility];  Service: Thoracic;  Laterality: N/A;   WISDOM TOOTH EXTRACTION     FAMILY HISTORY: History reviewed. No pertinent family history.  ADVANCED DIRECTIVES (Y/N):  N  HEALTH MAINTENANCE: Social History   Tobacco Use   Smoking status: Former    Years: 14.00    Types: Cigarettes    Quit date: 2001    Years since quitting: 23.1   Smokeless tobacco: Former    Types: Chew    Quit date: 12/18/2017  Vaping Use   Vaping Use: Never used  Substance Use Topics   Alcohol use: Not Currently    Alcohol/week: 3.0 standard drinks of alcohol    Types: 3 Cans of beer per week    Comment: social   Drug use: Never    Colonoscopy:  PAP:  Bone density:  Lipid panel:  No Known Allergies  Current Outpatient Medications  Medication Sig Dispense Refill   azithromycin (ZITHROMAX) 250 MG tablet Take 2 tablets ('500mg'$ ) by mouth on Day 1 then take 1 tablet  ('250mg'$ ) by mouth on Days 2 - 5. 6 tablet 0   Fluticasone Propionate (XHANCE) 93 MCG/ACT EXHU Place 1 spray into both nostrils 2 (two) times daily.     methylPREDNISolone (MEDROL DOSEPAK) 4 MG TBPK tablet Take as directed per package instructions. 21 tablet 0   predniSONE (DELTASONE) 10 MG tablet Take 1 tablet (10 mg total) by mouth daily. 10 tablet 0   No current facility-administered medications for this visit.    OBJECTIVE: Vitals:   03/15/22 0901  BP: 112/85  Pulse: (!) 57  Temp: 97.9 F (36.6 C)     Body mass index is 28.7 kg/m.    ECOG FS:0 - Asymptomatic  General: Well-developed, well-nourished, no acute distress. Eyes: Pink conjunctiva, anicteric sclera. Lungs: Clear to auscultation bilaterally.  No audible wheezing or coughing Heart: Regular rate and rhythm.  Abdomen: Soft, nontender, nondistended.  Musculoskeletal: No edema, cyanosis, or clubbing. Neuro: Alert, answering all questions appropriately. Cranial nerves grossly intact. Skin: No rashes or petechiae noted. Psych: Normal affect.   LAB RESULTS:       STUDIES: No results found.  ASSESSMENT: Wegener's granulomatosis.   PLAN:   1. Wegener's granulomatosis: per rheumatology, Dr. Posey Pronto. Previously followed by Dr. Jefm Bryant. Reviewed note from 01/04/22. Tolerating rituximab biosimilar well at reduced dose of 500 mg. No recurrent symptoms. Labs from [place] reviewed and acceptable for treatment. Previously Hepatitis labs were negative. He will only receive D1 of medication, skip D15, every 6 months. He will continue to have all labs and follow up related to his wegener's with rheumatology. Notified  pharmacy of need to order rituxiance in 6 months as well. He will need to have labs performed week prior to treatment.   Disposition:  Ruxience today 6 mo- See Dr. Grayland Ormond or myself, +/- Ruxience- la   I spent a total of 30 minutes reviewing chart data, face-to-face evaluation with the patient, counseling and  coordination of care as detailed above.  Patient expressed understanding and was in agreement with this plan. He also understands that He can call clinic at any time with any questions, concerns, or complaints.    Verlon Au, NP   03/15/2022

## 2022-03-23 DIAGNOSIS — R0602 Shortness of breath: Secondary | ICD-10-CM | POA: Diagnosis not present

## 2022-03-23 DIAGNOSIS — R809 Proteinuria, unspecified: Secondary | ICD-10-CM | POA: Diagnosis not present

## 2022-03-23 DIAGNOSIS — Z0000 Encounter for general adult medical examination without abnormal findings: Secondary | ICD-10-CM | POA: Diagnosis not present

## 2022-04-21 DIAGNOSIS — R0602 Shortness of breath: Secondary | ICD-10-CM | POA: Diagnosis not present

## 2022-05-05 DIAGNOSIS — I776 Arteritis, unspecified: Secondary | ICD-10-CM | POA: Diagnosis not present

## 2022-05-05 DIAGNOSIS — M3130 Wegener's granulomatosis without renal involvement: Secondary | ICD-10-CM | POA: Diagnosis not present

## 2022-05-05 DIAGNOSIS — Z7960 Long term (current) use of unspecified immunomodulators and immunosuppressants: Secondary | ICD-10-CM | POA: Diagnosis not present

## 2022-06-08 ENCOUNTER — Other Ambulatory Visit (HOSPITAL_COMMUNITY): Payer: Self-pay

## 2022-06-09 ENCOUNTER — Other Ambulatory Visit (HOSPITAL_COMMUNITY): Payer: Self-pay

## 2022-06-09 ENCOUNTER — Other Ambulatory Visit: Payer: Self-pay | Admitting: Hematology and Oncology

## 2022-06-09 ENCOUNTER — Encounter: Payer: Self-pay | Admitting: Oncology

## 2022-06-09 MED ORDER — GABAPENTIN 100 MG PO CAPS
100.0000 mg | ORAL_CAPSULE | Freq: Every evening | ORAL | 11 refills | Status: AC
  Filled 2022-06-09: qty 90, 30d supply, fill #0

## 2022-06-30 DIAGNOSIS — J320 Chronic maxillary sinusitis: Secondary | ICD-10-CM | POA: Diagnosis not present

## 2022-06-30 DIAGNOSIS — M3130 Wegener's granulomatosis without renal involvement: Secondary | ICD-10-CM | POA: Diagnosis not present

## 2022-07-07 ENCOUNTER — Other Ambulatory Visit (HOSPITAL_COMMUNITY): Payer: Self-pay

## 2022-08-19 DIAGNOSIS — L03011 Cellulitis of right finger: Secondary | ICD-10-CM | POA: Diagnosis not present

## 2022-08-20 ENCOUNTER — Other Ambulatory Visit (HOSPITAL_COMMUNITY): Payer: Self-pay

## 2022-08-20 ENCOUNTER — Other Ambulatory Visit: Payer: Self-pay

## 2022-08-20 MED ORDER — MUPIROCIN 2 % EX OINT
1.0000 | TOPICAL_OINTMENT | Freq: Three times a day (TID) | CUTANEOUS | 0 refills | Status: AC
  Filled 2022-08-20: qty 22, 15d supply, fill #0

## 2022-08-20 MED ORDER — DOXYCYCLINE HYCLATE 100 MG PO TABS
100.0000 mg | ORAL_TABLET | Freq: Two times a day (BID) | ORAL | 0 refills | Status: DC
  Filled 2022-08-20: qty 20, 10d supply, fill #0

## 2022-08-20 MED ORDER — MUPIROCIN 2 % EX OINT
TOPICAL_OINTMENT | Freq: Three times a day (TID) | CUTANEOUS | 0 refills | Status: AC
  Filled 2022-08-20 (×2): qty 22, 7d supply, fill #0

## 2022-08-20 MED ORDER — DOXYCYCLINE HYCLATE 100 MG PO TABS
100.0000 mg | ORAL_TABLET | Freq: Two times a day (BID) | ORAL | 0 refills | Status: AC
  Filled 2022-08-20 (×2): qty 20, 10d supply, fill #0

## 2022-08-24 DIAGNOSIS — J320 Chronic maxillary sinusitis: Secondary | ICD-10-CM | POA: Diagnosis not present

## 2022-08-24 DIAGNOSIS — M3130 Wegener's granulomatosis without renal involvement: Secondary | ICD-10-CM | POA: Diagnosis not present

## 2022-09-07 DIAGNOSIS — Z7960 Long term (current) use of unspecified immunomodulators and immunosuppressants: Secondary | ICD-10-CM | POA: Diagnosis not present

## 2022-09-07 DIAGNOSIS — M3130 Wegener's granulomatosis without renal involvement: Secondary | ICD-10-CM | POA: Diagnosis not present

## 2022-09-07 DIAGNOSIS — I776 Arteritis, unspecified: Secondary | ICD-10-CM | POA: Diagnosis not present

## 2022-09-10 ENCOUNTER — Other Ambulatory Visit: Payer: Self-pay

## 2022-09-10 DIAGNOSIS — E875 Hyperkalemia: Secondary | ICD-10-CM | POA: Diagnosis not present

## 2022-09-10 DIAGNOSIS — R002 Palpitations: Secondary | ICD-10-CM | POA: Diagnosis not present

## 2022-09-10 DIAGNOSIS — F411 Generalized anxiety disorder: Secondary | ICD-10-CM | POA: Diagnosis not present

## 2022-09-10 DIAGNOSIS — L03011 Cellulitis of right finger: Secondary | ICD-10-CM | POA: Diagnosis not present

## 2022-09-10 MED ORDER — CEPHALEXIN 500 MG PO CAPS
500.0000 mg | ORAL_CAPSULE | Freq: Three times a day (TID) | ORAL | 0 refills | Status: DC
  Filled 2022-09-10: qty 21, 7d supply, fill #0

## 2022-09-13 ENCOUNTER — Inpatient Hospital Stay: Payer: 59

## 2022-09-13 ENCOUNTER — Encounter: Payer: Self-pay | Admitting: Nurse Practitioner

## 2022-09-13 ENCOUNTER — Inpatient Hospital Stay: Payer: 59 | Attending: Nurse Practitioner | Admitting: Nurse Practitioner

## 2022-09-13 VITALS — BP 103/67 | HR 65 | Temp 96.0°F | Resp 18

## 2022-09-13 VITALS — BP 105/79 | HR 57 | Temp 96.1°F | Wt 201.0 lb

## 2022-09-13 DIAGNOSIS — Z79620 Long term (current) use of immunosuppressive biologic: Secondary | ICD-10-CM

## 2022-09-13 DIAGNOSIS — M3130 Wegener's granulomatosis without renal involvement: Secondary | ICD-10-CM | POA: Diagnosis not present

## 2022-09-13 DIAGNOSIS — Z79899 Other long term (current) drug therapy: Secondary | ICD-10-CM | POA: Diagnosis not present

## 2022-09-13 MED ORDER — SODIUM CHLORIDE 0.9 % IV SOLN
500.0000 mg | Freq: Once | INTRAVENOUS | Status: AC
  Administered 2022-09-13: 500 mg via INTRAVENOUS
  Filled 2022-09-13 (×2): qty 50

## 2022-09-13 MED ORDER — SODIUM CHLORIDE 0.9 % IV SOLN
Freq: Once | INTRAVENOUS | Status: AC
  Filled 2022-09-13: qty 250

## 2022-09-13 MED ORDER — METHYLPREDNISOLONE SODIUM SUCC 125 MG IJ SOLR
100.0000 mg | Freq: Once | INTRAMUSCULAR | Status: AC
  Administered 2022-09-13: 100 mg via INTRAVENOUS
  Filled 2022-09-13: qty 2

## 2022-09-13 MED ORDER — ACETAMINOPHEN 325 MG PO TABS
650.0000 mg | ORAL_TABLET | Freq: Once | ORAL | Status: AC
  Administered 2022-09-13: 650 mg via ORAL
  Filled 2022-09-13: qty 2

## 2022-09-13 MED ORDER — DIPHENHYDRAMINE HCL 50 MG/ML IJ SOLN
50.0000 mg | Freq: Once | INTRAMUSCULAR | Status: AC
  Administered 2022-09-13: 50 mg via INTRAVENOUS
  Filled 2022-09-13: qty 1

## 2022-09-13 NOTE — Progress Notes (Signed)
[facility]  [phone] Fax:(336) [phone]  ID: Michelle Piper OB: [birth year]  MR#: [id number]  QIH#:[id number]  Patient Care Team: Danella Penton, MD as PCP - General (Internal Medicine)  CHIEF COMPLAINT: Wegener's granulomatosis.   INTERVAL HISTORY: Patient returns to clinic today for routine 57-month evaluation and continuation of Ruxience. He continues to feel well. Denies interval infections. No shortness of breath. Continues to tolerate infusions well. Questions need for ongoing infusions and has discussed with rheumatology who recommend continuing. He tolerates ruxience well without significant side effects. No neurological complaints. No hx of infusion reaction. No specific complaints today.   REVIEW OF SYSTEMS:   Review of Systems  Constitutional: Negative.  Negative for fever, malaise/fatigue and weight loss.  Respiratory: Negative.  Negative for cough, hemoptysis and shortness of breath.   Cardiovascular:  Negative for chest pain and leg swelling.  Gastrointestinal: Negative.  Negative for abdominal pain.  Genitourinary: Negative.  Negative for dysuria.  Musculoskeletal: Negative.  Negative for back pain.  Skin: Negative.  Negative for rash.  Neurological: Negative.  Negative for dizziness, focal weakness, weakness and headaches.  Psychiatric/Behavioral: Negative.  The patient is not nervous/anxious.   As per HPI. Otherwise, a complete review of systems is negative.  PAST MEDICAL HISTORY: Past Medical History:  Diagnosis Date   BMI 28.0-28.9,adult    Headache    sinus   Wegener's granulomatosis (granulomatosis with polyangiitis)     PAST SURGICAL HISTORY: Past Surgical History:  Procedure Laterality Date   BIOPSY  01/06/2022   Procedure: BIOPSY;  Surgeon: Meridee Score, Netty Starring., MD;  Location: [facility];  Service: Gastroenterology;;   COLONOSCOPY WITH PROPOFOL N/A 09/16/2020   Procedure: COLONOSCOPY WITH PROPOFOL;  Surgeon: Midge Minium, MD;  Location: [facility];  Service: Endoscopy;  Laterality: N/A;   ENDOSCOPIC MUCOSAL RESECTION N/A 10/30/2020   Procedure: ENDOSCOPIC MUCOSAL RESECTION;  Surgeon: Meridee Score Netty Starring., MD;  Location: [facility] END[facility]COPY;  Service: Gastroenterology;  Laterality: N/A;   ETHMOIDECTOMY Bilateral 11/09/2018   Procedure: Total ETHMOIDECTOMY with sphenoidotomy-Left Total Ethmoidectomy with Sphenoidectomy (Tissue Removal)-Right;  Surgeon: Vernie Murders, MD;  Location: [facility];  Service: ENT;  Laterality: Bilateral;   EUS N/A 10/30/2020   Procedure: LOWER ENDOSCOPIC ULTRASOUND (EUS);  Surgeon: Lemar Lofty., MD;  Location: [facility] ENDOSCOPY;  Service: Gastroenterology;  Laterality: N/A;   EUS N/A 01/06/2022   Procedure: LOWER ENDOSCOPIC ULTRASOUND (EUS);  Surgeon: Lemar Lofty., MD;  Location: [facility];  Service: Gastroenterology;  Laterality: N/A;   FLEXIBLE SIGMOIDOSCOPY N/A 09/25/2020   Procedure: FLEXIBLE SIGMOIDOSCOPY;  Surgeon: Midge Minium, MD;  Location: [facility];  Service: Endoscopy;  Laterality: N/A;   FLEXIBLE SIGMOIDOSCOPY N/A 10/30/2020   Procedure: FLEXIBLE SIGMOIDOSCOPY;  Surgeon: Meridee Score Netty Starring., MD;  Location: [facility];  Service: Gastroenterology;  Laterality: N/A;   FLEXIBLE SIGMOIDOSCOPY N/A 01/06/2022   Procedure: FLEXIBLE SIGMOIDOSCOPY;  Surgeon: Meridee Score Netty Starring., MD;  Location: [facility];  Service: Gastroenterology;  Laterality: N/A;   FRONTAL SINUS EXPLORATION Bilateral 11/09/2018   Procedure: FRONTAL SINUSotomy;  Surgeon: Vernie Murders, MD;  Location: [facility];  Service: ENT;  Laterality: Bilateral;   HEMOSTASIS CLIP PLACEMENT  10/30/2020   Procedure: HEMOSTASIS CLIP PLACEMENT;  Surgeon: Lemar Lofty., MD;  Location: [facility] ENDOSCOPY;  Service: Gastroenterology;;   IMAGE GUIDED SINUS SURGERY Bilateral 11/09/2018   Procedure: IMAGE GUIDED SINUS SURGERY;  Surgeon: Vernie Murders, MD;  Location: [facility]  SURGERY CN[facility];  Service: ENT;  Laterality: Bilateral;  NEED STRYKER DISK  LUNG BIOPSY     MAXILLARY ANTROSTOMY Bilateral 11/09/2018   Procedure: MAXILLARY ANTROSTOMY with removal of tissue;  Surgeon: Vernie Murders, MD;  Location: [facility];  Service: ENT;  Laterality: Bilateral;   SEPTOPLASTY Bilateral 11/09/2018   Procedure: SEPTOPLASTY;  Surgeon: Vernie Murders, MD;  Location: [facility];  Service: ENT;  Laterality: Bilateral;  Gave disk to Steward Drone 9-30   SUBMUCOSAL LIFTING INJECTION  10/30/2020   Procedure: SUBMUCOSAL LIFTING INJECTION;  Surgeon: Lemar Lofty., MD;  Location: [facility] ENDOSCOPY;  Service: Gastroenterology;;   Frederik Schmidt REDUCTION Bilateral 11/09/2018   Procedure: Frederik Schmidt REDUCTION-inferior;  Surgeon: Vernie Murders, MD;  Location: [facility];  Service: ENT;  Laterality: Bilateral;   VIDEO BRONCHOSCOPY WITH ENDOBRONCHIAL NAVIGATION N/A 12/22/2018   Procedure: VIDEO BRONCHOSCOPY WITH ENDOBRONCHIAL NAVIGATION;  Surgeon: Vida Rigger, MD;  Location: [facility];  Service: Thoracic;  Laterality: N/A;   WISDOM TOOTH EXTRACTION     FAMILY HISTORY: History reviewed. No pertinent family history.  ADVANCED DIRECTIVES (Y/N):  N  HEALTH MAINTENANCE: Social History   Tobacco Use   Smoking status: Former    Current packs/day: 0.00    Types: Cigarettes    Start date: 26    Quit date: 2001    Years since quitting: 23.6   Smokeless tobacco: Former    Types: Chew    Quit date: 12/18/2017  Vaping Use   Vaping status: Never Used  Substance Use Topics   Alcohol use: Not Currently    Alcohol/week: 3.0 standard drinks of alcohol    Types: 3 Cans of beer per week    Comment: social   Drug use: Never    Colonoscopy:  PAP:  Bone density:  Lipid panel:  No Known Allergies  Current Outpatient Medications  Medication Sig Dispense Refill   azithromycin (ZITHROMAX) 250 MG tablet Take 2 tablets (500mg ) by mouth on Day 1 then take 1 tablet (250mg ) by  mouth on Days 2 - 5. 6 tablet 0   cephALEXin (KEFLEX) 500 MG capsule Take 1 capsule (500 mg total) by mouth 3 (three) times daily for 7 days. 21 capsule 0   doxycycline (VIBRA-TABS) 100 MG tablet Take 1 tablet (100 mg total) by mouth 2 (two) times daily for 10 days. 20 tablet 0   Fluticasone Propionate (XHANCE) 93 MCG/ACT EXHU Place 1 spray into both nostrils 2 (two) times daily.     gabapentin (NEURONTIN) 100 MG capsule Take 1-3 capsules (100-300 mg total) by mouth at bedtime. 90 capsule 11   methylPREDNISolone (MEDROL DOSEPAK) 4 MG TBPK tablet Take as directed per package instructions. 21 tablet 0   mupirocin ointment (BACTROBAN) 2 % Apply 1 Application topically 3 (three) times daily for 7 days. 22 g 0   mupirocin ointment (BACTROBAN) 2 % Apply topically 3 (three) times daily for 7 days 22 g 0   predniSONE (DELTASONE) 10 MG tablet Take 1 tablet (10 mg total) by mouth daily. 10 tablet 0   No current facility-administered medications for this visit.    OBJECTIVE: Vitals:   09/13/22 0912  BP: 105/79  Pulse: (!) 57  Temp: (!) 96.1 F (35.6 C)  SpO2: 100%     Body mass index is 28.84 kg/m.    ECOG FS:0 - Asymptomatic  General: Well-developed, well-nourished, no acute distress. Eyes: Pink conjunctiva, anicteric sclera. Lungs: Clear to auscultation bilaterally.  No audible wheezing or coughing Heart: Regular rate and rhythm.  Abdomen: Soft, nontender, nondistended.  Musculoskeletal: No edema, cyanosis, or clubbing. Neuro: Alert,  answering all questions appropriately. Cranial nerves grossly intact. Skin: No rashes or petechiae noted. Psych: Normal affect.   LAB RESULTS:    .     STUDIES: No results found.  ASSESSMENT: Wegener's granulomatosis.   PLAN:   1. Wegener's granulomatosis: per rheumatology, Dr. Allena Katz. Previously followed by Dr. Gavin Potters. Reviewed note from 09/07/22. Tolerating rituximab biosimilar well at reduced dose of 500 mg. No recurrent symptoms. He continues  rituxan 500 mg every 6 months. Labs from [facility] reviewed and acceptable for treatment. Previously Hepatitis labs were negative. He will only receive D1 of medication, skip D15, every 6 months. He will continue to have all labs and follow up related to his wegener's with rheumatology. Notified pharmacy of need to order rituxiance in 6 months as well. He will need to have labs performed week prior to treatment.   Disposition:  Ruxience today 6 mo- See Dr. Orlie Dakin or myself, +/- Ruxience- la  I spent a total of 30 minutes reviewing chart data, face-to-face evaluation with the patient, counseling and coordination of care as detailed above.  Patient expressed understanding and was in agreement with this plan. He also understands that He can call clinic at any time with any questions, concerns, or complaints.   Alinda Dooms, NP   09/13/2022

## 2022-09-21 DIAGNOSIS — R809 Proteinuria, unspecified: Secondary | ICD-10-CM | POA: Diagnosis not present

## 2022-09-23 ENCOUNTER — Other Ambulatory Visit (HOSPITAL_COMMUNITY): Payer: Self-pay

## 2022-09-23 DIAGNOSIS — L03019 Cellulitis of unspecified finger: Secondary | ICD-10-CM | POA: Diagnosis not present

## 2022-09-23 MED ORDER — LEVOFLOXACIN 500 MG PO TABS
500.0000 mg | ORAL_TABLET | Freq: Every day | ORAL | 0 refills | Status: DC
  Filled 2022-09-23: qty 7, 7d supply, fill #0

## 2022-09-27 ENCOUNTER — Other Ambulatory Visit (HOSPITAL_COMMUNITY): Payer: Self-pay

## 2022-09-27 MED ORDER — SERTRALINE HCL 50 MG PO TABS
50.0000 mg | ORAL_TABLET | Freq: Every day | ORAL | 3 refills | Status: DC
  Filled 2022-09-27: qty 90, 90d supply, fill #0

## 2022-10-26 DIAGNOSIS — R5383 Other fatigue: Secondary | ICD-10-CM | POA: Diagnosis not present

## 2022-12-31 ENCOUNTER — Other Ambulatory Visit (HOSPITAL_COMMUNITY): Payer: Self-pay

## 2023-01-01 ENCOUNTER — Other Ambulatory Visit (HOSPITAL_COMMUNITY): Payer: Self-pay

## 2023-01-01 MED ORDER — XHANCE 93 MCG/ACT NA EXHU
1.0000 | INHALANT_SUSPENSION | Freq: Two times a day (BID) | NASAL | 11 refills | Status: AC
Start: 2022-07-01 — End: ?
  Filled 2023-01-01: qty 16, 30d supply, fill #0

## 2023-01-03 ENCOUNTER — Other Ambulatory Visit (HOSPITAL_COMMUNITY): Payer: Self-pay

## 2023-01-03 ENCOUNTER — Encounter: Payer: Self-pay | Admitting: Oncology

## 2023-01-03 MED ORDER — DOXYCYCLINE HYCLATE 100 MG PO CAPS
100.0000 mg | ORAL_CAPSULE | Freq: Two times a day (BID) | ORAL | 0 refills | Status: DC
  Filled 2023-01-03: qty 14, 7d supply, fill #0

## 2023-01-05 ENCOUNTER — Other Ambulatory Visit (HOSPITAL_COMMUNITY): Payer: Self-pay

## 2023-01-08 ENCOUNTER — Other Ambulatory Visit (HOSPITAL_COMMUNITY): Payer: Self-pay

## 2023-01-12 DIAGNOSIS — L820 Inflamed seborrheic keratosis: Secondary | ICD-10-CM | POA: Diagnosis not present

## 2023-01-12 DIAGNOSIS — L814 Other melanin hyperpigmentation: Secondary | ICD-10-CM | POA: Diagnosis not present

## 2023-01-12 DIAGNOSIS — D485 Neoplasm of uncertain behavior of skin: Secondary | ICD-10-CM | POA: Diagnosis not present

## 2023-01-12 DIAGNOSIS — L538 Other specified erythematous conditions: Secondary | ICD-10-CM | POA: Diagnosis not present

## 2023-01-12 DIAGNOSIS — D225 Melanocytic nevi of trunk: Secondary | ICD-10-CM | POA: Diagnosis not present

## 2023-01-12 DIAGNOSIS — L821 Other seborrheic keratosis: Secondary | ICD-10-CM | POA: Diagnosis not present

## 2023-01-25 DIAGNOSIS — M3130 Wegener's granulomatosis without renal involvement: Secondary | ICD-10-CM | POA: Diagnosis not present

## 2023-01-25 DIAGNOSIS — I776 Arteritis, unspecified: Secondary | ICD-10-CM | POA: Diagnosis not present

## 2023-01-25 DIAGNOSIS — Z7960 Long term (current) use of unspecified immunomodulators and immunosuppressants: Secondary | ICD-10-CM | POA: Diagnosis not present

## 2023-02-04 ENCOUNTER — Other Ambulatory Visit (HOSPITAL_COMMUNITY): Payer: Self-pay

## 2023-02-22 DIAGNOSIS — H903 Sensorineural hearing loss, bilateral: Secondary | ICD-10-CM | POA: Diagnosis not present

## 2023-02-22 DIAGNOSIS — J320 Chronic maxillary sinusitis: Secondary | ICD-10-CM | POA: Diagnosis not present

## 2023-02-22 DIAGNOSIS — M3130 Wegener's granulomatosis without renal involvement: Secondary | ICD-10-CM | POA: Diagnosis not present

## 2023-03-16 ENCOUNTER — Encounter: Payer: Self-pay | Admitting: *Deleted

## 2023-03-16 ENCOUNTER — Inpatient Hospital Stay: Payer: 59 | Attending: Oncology | Admitting: Oncology

## 2023-03-16 ENCOUNTER — Encounter: Payer: Self-pay | Admitting: Oncology

## 2023-03-16 ENCOUNTER — Inpatient Hospital Stay: Payer: 59

## 2023-03-16 VITALS — BP 106/78 | HR 71 | Temp 98.0°F | Resp 18

## 2023-03-16 VITALS — BP 107/85 | HR 61 | Temp 97.6°F | Resp 16 | Ht 70.0 in | Wt 197.8 lb

## 2023-03-16 DIAGNOSIS — M3130 Wegener's granulomatosis without renal involvement: Secondary | ICD-10-CM

## 2023-03-16 DIAGNOSIS — Z79899 Other long term (current) drug therapy: Secondary | ICD-10-CM | POA: Insufficient documentation

## 2023-03-16 DIAGNOSIS — Z5112 Encounter for antineoplastic immunotherapy: Secondary | ICD-10-CM | POA: Insufficient documentation

## 2023-03-16 MED ORDER — DIPHENHYDRAMINE HCL 50 MG/ML IJ SOLN
50.0000 mg | Freq: Once | INTRAMUSCULAR | Status: AC
  Administered 2023-03-16: 50 mg via INTRAVENOUS
  Filled 2023-03-16: qty 1

## 2023-03-16 MED ORDER — METHYLPREDNISOLONE SODIUM SUCC 125 MG IJ SOLR
100.0000 mg | Freq: Once | INTRAMUSCULAR | Status: AC
  Administered 2023-03-16: 100 mg via INTRAVENOUS
  Filled 2023-03-16: qty 2

## 2023-03-16 MED ORDER — RITUXIMAB-ABBS CHEMO 500 MG/50ML IV SOLN
500.0000 mg | Freq: Once | INTRAVENOUS | Status: AC
  Administered 2023-03-16: 500 mg via INTRAVENOUS
  Filled 2023-03-16: qty 50

## 2023-03-16 MED ORDER — SODIUM CHLORIDE 0.9 % IV SOLN
Freq: Once | INTRAVENOUS | Status: AC
Start: 2023-03-16 — End: 2023-03-16
  Filled 2023-03-16: qty 250

## 2023-03-16 MED ORDER — ACETAMINOPHEN 325 MG PO TABS
650.0000 mg | ORAL_TABLET | Freq: Once | ORAL | Status: AC
Start: 2023-03-16 — End: 2023-03-16
  Administered 2023-03-16: 650 mg via ORAL
  Filled 2023-03-16: qty 2

## 2023-03-16 NOTE — Progress Notes (Signed)
 [facility]  [phone] [phone]  ID: Thomas Gibbs OB: Feb 10, 1970  MR#: [id number]  RDW#:[id number]  Patient Care Team: Cleotilde Oneil FALCON, MD as PCP - General (Internal Medicine)  CHIEF COMPLAINT: Wegener's granulomatosis.   INTERVAL HISTORY: Patient returns to clinic today for routine 36-month evaluation and continuation of Ruxience .  He continues to feel well and remains asymptomatic.  He is tolerating his treatments without significant side effects. He has no neurologic complaints.  He denies any recent fevers or illnesses.  He has a good appetite and denies weight loss.  He has no chest pain, shortness of breath, cough, or hemoptysis.  He denies any nausea, vomiting, constipation, or diarrhea.  He has no urinary complaints.  Patient offers no specific complaints today.  REVIEW OF SYSTEMS:   Review of Systems  Constitutional: Negative.  Negative for fever, malaise/fatigue and weight loss.  Respiratory: Negative.  Negative for cough, hemoptysis and shortness of breath.   Cardiovascular:  Negative for chest pain and leg swelling.  Gastrointestinal: Negative.  Negative for abdominal pain.  Genitourinary: Negative.  Negative for dysuria.  Musculoskeletal: Negative.  Negative for back pain.  Skin: Negative.  Negative for rash.  Neurological: Negative.  Negative for dizziness, focal weakness, weakness and headaches.  Psychiatric/Behavioral: Negative.  The patient is not nervous/anxious.     As per HPI. Otherwise, a complete review of systems is negative.  PAST MEDICAL HISTORY: Past Medical History:  Diagnosis Date   BMI 28.0-28.9,adult    Headache    sinus   Wegener's granulomatosis (granulomatosis with polyangiitis)     PAST SURGICAL HISTORY: Past Surgical History:  Procedure Laterality Date   BIOPSY  01/06/2022   Procedure: BIOPSY;  Surgeon: Wilhelmenia, Aloha Raddle., MD;  Location: [facility];  Service: Gastroenterology;;    COLONOSCOPY WITH PROPOFOL  N/A 09/16/2020   Procedure: COLONOSCOPY WITH PROPOFOL ;  Surgeon: Jinny Carmine, MD;  Location: [facility];  Service: Endoscopy;  Laterality: N/A;   ENDOSCOPIC MUCOSAL RESECTION N/A 10/30/2020   Procedure: ENDOSCOPIC MUCOSAL RESECTION;  Surgeon: Wilhelmenia Aloha Raddle., MD;  Location: [facility];  Service: Gastroenterology;  Laterality: N/A;   ETHMOIDECTOMY Bilateral 11/09/2018   Procedure: Total ETHMOIDECTOMY with sphenoidotomy-Left Total Ethmoidectomy with Sphenoidectomy (Tissue Removal)-Right;  Surgeon: Edda Mt, MD;  Location: [facility];  Service: ENT;  Laterality: Bilateral;   EUS N/A 10/30/2020   Procedure: LOWER ENDOSCOPIC ULTRASOUND (EUS);  Surgeon: Wilhelmenia Aloha Raddle., MD;  Location: [facility] END[facility]COPY;  Service: Gastroenterology;  Laterality: N/A;   EUS N/A 01/06/2022   Procedure: LOWER ENDOSCOPIC ULTRASOUND (EUS);  Surgeon: Wilhelmenia Aloha Raddle., MD;  Location: [facility];  Service: Gastroenterology;  Laterality: N/A;   FLEXIBLE SIGMOIDOSCOPY N/A 09/25/2020   Procedure: FLEXIBLE SIGMOIDOSCOPY;  Surgeon: Jinny Carmine, MD;  Location: [facility];  Service: Endoscopy;  Laterality: N/A;   FLEXIBLE SIGMOIDOSCOPY N/A 10/30/2020   Procedure: FLEXIBLE SIGMOIDOSCOPY;  Surgeon: Wilhelmenia Aloha Raddle., MD;  Location: [facility] ENDOSCOPY;  Service: Gastroenterology;  Laterality: N/A;   FLEXIBLE SIGMOIDOSCOPY N/A 01/06/2022   Procedure: FLEXIBLE SIGMOIDOSCOPY;  Surgeon: Wilhelmenia Aloha Raddle., MD;  Location: [facility];  Service: Gastroenterology;  Laterality: N/A;   FRONTAL SINUS EXPLORATION Bilateral 11/09/2018   Procedure: FRONTAL SINUSotomy;  Surgeon: Edda Mt, MD;  Location: [facility];  Service: ENT;  Laterality: Bilateral;   HEMOSTASIS CLIP PLACEMENT  10/30/2020   Procedure: HEMOSTASIS CLIP PLACEMENT;  Surgeon: Wilhelmenia Aloha Raddle., MD;  Location: [facility] ENDOSCOPY;  Service: Gastroenterology;;   IMAGE GUIDED SINUS SURGERY Bilateral 11/09/2018  Procedure: IMAGE GUIDED SINUS SURGERY;  Surgeon: Edda Mt, MD;  Location: [facility];  Service: ENT;  Laterality: Bilateral;  NEED STRYKER DISK   LUNG BIOPSY     MAXILLARY ANTROSTOMY Bilateral 11/09/2018   Procedure: MAXILLARY ANTROSTOMY with removal of tissue;  Surgeon: Edda Mt, MD;  Location: [facility];  Service: ENT;  Laterality: Bilateral;   SEPTOPLASTY Bilateral 11/09/2018   Procedure: SEPTOPLASTY;  Surgeon: Edda Mt, MD;  Location: [facility];  Service: ENT;  Laterality: Bilateral;  Gave disk to Erminio 9-30   SUBMUCOSAL LIFTING INJECTION  10/30/2020   Procedure: SUBMUCOSAL LIFTING INJECTION;  Surgeon: Wilhelmenia Aloha Raddle., MD;  Location: [facility];  Service: Gastroenterology;;   MONTA REDUCTION Bilateral 11/09/2018   Procedure: MONTA REDUCTION-inferior;  Surgeon: Edda Mt, MD;  Location: [facility];  Service: ENT;  Laterality: Bilateral;   VIDEO BRONCHOSCOPY WITH ENDOBRONCHIAL NAVIGATION N/A 12/22/2018   Procedure: VIDEO BRONCHOSCOPY WITH ENDOBRONCHIAL NAVIGATION;  Surgeon: Parris Manna, MD;  Location: [facility];  Service: Thoracic;  Laterality: N/A;   WISDOM TOOTH EXTRACTION      FAMILY HISTORY: History reviewed. No pertinent family history.  ADVANCED DIRECTIVES (Y/N):  N  HEALTH MAINTENANCE: Social History   Tobacco Use   Smoking status: Former    Current packs/day: 0.00    Types: Cigarettes    Start date: 49    Quit date: 2001    Years since quitting: 24.1   Smokeless tobacco: Former    Types: Chew    Quit date: 12/18/2017  Vaping Use   Vaping status: Never Used  Substance Use Topics   Alcohol use: Not Currently    Alcohol/week: 3.0 standard drinks of alcohol    Types: 3 Cans of beer per week    Comment: social   Drug use: Never     Colonoscopy:  PAP:  Bone density:  Lipid panel:  No Known Allergies  Current Outpatient Medications  Medication Sig Dispense Refill   Fluticasone   Propionate (XHANCE ) 93 MCG/ACT EXHU Place 1 spray into both nostrils 2 (two) times daily.     Fluticasone  Propionate (XHANCE ) 93 MCG/ACT EXHU Place 1 spray into both nostrils 2 (two) times daily. (Patient not taking: Reported on 03/16/2023) 16 mL 11   gabapentin  (NEURONTIN ) 100 MG capsule Take 1-3 capsules (100-300 mg total) by mouth at bedtime. (Patient not taking: Reported on 03/16/2023) 90 capsule 11   methylPREDNISolone  (MEDROL  DOSEPAK) 4 MG TBPK tablet Take as directed per package instructions. (Patient not taking: Reported on 03/16/2023) 21 tablet 0   mupirocin  ointment (BACTROBAN ) 2 % Apply 1 Application topically 3 (three) times daily for 7 days. (Patient not taking: Reported on 03/16/2023) 22 g 0   mupirocin  ointment (BACTROBAN ) 2 % Apply topically 3 (three) times daily for 7 days (Patient not taking: Reported on 03/16/2023) 22 g 0   predniSONE  (DELTASONE ) 10 MG tablet Take 1 tablet (10 mg total) by mouth daily. (Patient not taking: Reported on 03/16/2023) 10 tablet 0   sertraline  (ZOLOFT ) 50 MG tablet Take 1 tablet (50 mg total) by mouth daily. (Patient not taking: Reported on 03/16/2023) 90 tablet 3   No current facility-administered medications for this visit.    OBJECTIVE: Vitals:   03/16/23 0839 03/16/23 0842  BP: 107/85   Pulse: 61   Resp: 16   Temp: 97.6 F (36.4 C) 97.6 F (36.4 C)  SpO2: 100%      Body mass index is 28.38 kg/m.    ECOG FS:0 - Asymptomatic  General: Well-developed, well-nourished, no acute distress. Eyes: Pink conjunctiva, anicteric sclera. HEENT: Normocephalic, moist mucous membranes. Lungs: No audible wheezing or coughing. Heart: Regular rate and rhythm. Abdomen: Soft, nontender, no obvious distention. Musculoskeletal: No edema, cyanosis, or clubbing. Neuro: Alert, answering all questions appropriately. Cranial nerves grossly intact. Skin: No rashes or petechiae noted. Psych: Normal affect.  LAB RESULTS:  No results found for: NA, K, CL, CO2,  GLUCOSE, BUN, CREATININE, CALCIUM, PROT, ALBUMIN, AST, ALT, ALKPHOS, BILITOT, GFRNONAA, GFRAA  Lab Results  Component Value Date   WBC 10.2 12/19/2018   HGB 12.4 (L) 12/19/2018   HCT 39.5 12/19/2018   MCV 92.7 12/19/2018   PLT 400 12/19/2018     STUDIES: No results found.  ASSESSMENT: Wegener's granulomatosis.   PLAN:   Wegener's granulomatosis: Chronic and unchanged.  Proceed with 500 mg maintenance IV Ruxience  (bio similar to Rituxan ) today.  Patient expressed understanding that all laboratory work and any symptoms related to his Wegener's will be monitored and evaluated per rheumatology.  Patient now under the care of Dr. Tobie.  No further intervention is needed.  Proceed with treatment today.  Return to clinic in 6 months for further evaluation and continuation of treatment as per rheumatology.  I spent a total of 30 minutes reviewing chart data, face-to-face evaluation with the patient, counseling and coordination of care as detailed above.   Patient expressed understanding and was in agreement with this plan. He also understands that He can call clinic at any time with any questions, concerns, or complaints.    Evalene JINNY Reusing, MD   03/16/2023 10:07 AM

## 2023-03-16 NOTE — Patient Instructions (Signed)
Rituximab Injection What is this medication? RITUXIMAB (ri TUX i mab) treats leukemia and lymphoma. It works by blocking a protein that causes cancer cells to grow and multiply. This helps to slow or stop the spread of cancer cells. It may also be used to treat autoimmune conditions, such as arthritis. It works by slowing down an overactive immune system. It is a monoclonal antibody. This medicine may be used for other purposes; ask your health care provider or pharmacist if you have questions. COMMON BRAND NAME(S): RIABNI, Rituxan, RUXIENCE, truxima What should I tell my care team before I take this medication? They need to know if you have any of these conditions: Chest pain Heart disease Immune system problems Infection, such as chickenpox, cold sores, hepatitis B, herpes Irregular heartbeat or rhythm Kidney disease Low blood counts, such as low white cells, platelets, red cells Lung disease Recent or upcoming vaccine An unusual or allergic reaction to rituximab, other medications, foods, dyes, or preservatives Pregnant or trying to get pregnant Breast-feeding How should I use this medication? This medication is injected into a vein. It is given by a care team in a hospital or clinic setting. A special MedGuide will be given to you before each treatment. Be sure to read this information carefully each time. Talk to your care team about the use of this medication in children. While this medication may be prescribed for children as young as 6 months for selected conditions, precautions do apply. Overdosage: If you think you have taken too much of this medicine contact a poison control center or emergency room at once. NOTE: This medicine is only for you. Do not share this medicine with others. What if I miss a dose? Keep appointments for follow-up doses. It is important not to miss your dose. Call your care team if you are unable to keep an appointment. What may interact with this  medication? Do not take this medication with any of the following: Live vaccines This medication may also interact with the following: Cisplatin This list may not describe all possible interactions. Give your health care provider a list of all the medicines, herbs, non-prescription drugs, or dietary supplements you use. Also tell them if you smoke, drink alcohol, or use illegal drugs. Some items may interact with your medicine. What should I watch for while using this medication? Your condition will be monitored carefully while you are receiving this medication. You may need blood work while taking this medication. This medication can cause serious infusion reactions. To reduce the risk your care team may give you other medications to take before receiving this one. Be sure to follow the directions from your care team. This medication may increase your risk of getting an infection. Call your care team for advice if you get a fever, chills, sore throat, or other symptoms of a cold or flu. Do not treat yourself. Try to avoid being around people who are sick. Call your care team if you are around anyone with measles, chickenpox, or if you develop sores or blisters that do not heal properly. Avoid taking medications that contain aspirin, acetaminophen, ibuprofen, naproxen, or ketoprofen unless instructed by your care team. These medications may hide a fever. This medication may cause serious skin reactions. They can happen weeks to months after starting the medication. Contact your care team right away if you notice fevers or flu-like symptoms with a rash. The rash may be red or purple and then turn into blisters or peeling of the skin.  You may also notice a red rash with swelling of the face, lips, or lymph nodes in your neck or under your arms. In some patients, this medication may cause a serious brain infection that may cause death. If you have any problems seeing, thinking, speaking, walking, or  standing, tell your care team right away. If you cannot reach your care team, urgently seek another source of medical care. Talk to your care team if you may be pregnant. Serious birth defects can occur if you take this medication during pregnancy and for 12 months after the last dose. You will need a negative pregnancy test before starting this medication. Contraception is recommended while taking this medication and for 12 months after the last dose. Your care team can help you find the option that works for you. Do not breastfeed while taking this medication and for at least 6 months after the last dose. What side effects may I notice from receiving this medication? Side effects that you should report to your care team as soon as possible: Allergic reactions or angioedema--skin rash, itching or hives, swelling of the face, eyes, lips, tongue, arms, or legs, trouble swallowing or breathing Bowel blockage--stomach cramping, unable to have a bowel movement or pass gas, loss of appetite, vomiting Dizziness, loss of balance or coordination, confusion or trouble speaking Heart attack--pain or tightness in the chest, shoulders, arms, or jaw, nausea, shortness of breath, cold or clammy skin, feeling faint or lightheaded Heart rhythm changes--fast or irregular heartbeat, dizziness, feeling faint or lightheaded, chest pain, trouble breathing Infection--fever, chills, cough, sore throat, wounds that don't heal, pain or trouble when passing urine, general feeling of discomfort or being unwell Infusion reactions--chest pain, shortness of breath or trouble breathing, feeling faint or lightheaded Kidney injury--decrease in the amount of urine, swelling of the ankles, hands, or feet Liver injury--right upper belly pain, loss of appetite, nausea, light-colored stool, dark yellow or brown urine, yellowing skin or eyes, unusual weakness or fatigue Redness, blistering, peeling, or loosening of the skin, including  inside the mouth Stomach pain that is severe, does not go away, or gets worse Tumor lysis syndrome (TLS)--nausea, vomiting, diarrhea, decrease in the amount of urine, dark urine, unusual weakness or fatigue, confusion, muscle pain or cramps, fast or irregular heartbeat, joint pain Side effects that usually do not require medical attention (report to your care team if they continue or are bothersome): Headache Joint pain Nausea Runny or stuffy nose Unusual weakness or fatigue This list may not describe all possible side effects. Call your doctor for medical advice about side effects. You may report side effects to FDA at 1-800-FDA-1088. Where should I keep my medication? This medication is given in a hospital or clinic. It will not be stored at home. NOTE: This sheet is a summary. It may not cover all possible information. If you have questions about this medicine, talk to your doctor, pharmacist, or health care provider.  2023 Elsevier/Gold Standard (2021-06-09 00:00:00)

## 2023-03-16 NOTE — Progress Notes (Signed)
 Only concerns he had was having a productive cough for the last couple of weeks.

## 2023-03-21 DIAGNOSIS — Z0000 Encounter for general adult medical examination without abnormal findings: Secondary | ICD-10-CM | POA: Diagnosis not present

## 2023-04-01 DIAGNOSIS — H903 Sensorineural hearing loss, bilateral: Secondary | ICD-10-CM | POA: Diagnosis not present

## 2023-04-04 DIAGNOSIS — Z0000 Encounter for general adult medical examination without abnormal findings: Secondary | ICD-10-CM | POA: Diagnosis not present

## 2023-04-05 ENCOUNTER — Other Ambulatory Visit (HOSPITAL_COMMUNITY): Payer: Self-pay

## 2023-04-05 MED ORDER — GABAPENTIN 100 MG PO CAPS
100.0000 mg | ORAL_CAPSULE | Freq: Every day | ORAL | 11 refills | Status: AC
Start: 2023-04-04 — End: ?
  Filled 2023-04-05: qty 90, 30d supply, fill #0
  Filled 2023-05-16 – 2023-06-01 (×2): qty 90, 30d supply, fill #1
  Filled 2023-06-27: qty 90, 30d supply, fill #2

## 2023-04-05 MED ORDER — SERTRALINE HCL 50 MG PO TABS
50.0000 mg | ORAL_TABLET | Freq: Every day | ORAL | 3 refills | Status: DC
  Filled 2023-04-05: qty 90, 90d supply, fill #0

## 2023-04-27 ENCOUNTER — Other Ambulatory Visit (HOSPITAL_COMMUNITY): Payer: Self-pay

## 2023-05-26 ENCOUNTER — Other Ambulatory Visit (HOSPITAL_COMMUNITY): Payer: Self-pay

## 2023-06-01 ENCOUNTER — Other Ambulatory Visit (HOSPITAL_COMMUNITY): Payer: Self-pay

## 2023-06-27 ENCOUNTER — Other Ambulatory Visit (HOSPITAL_COMMUNITY): Payer: Self-pay

## 2023-06-28 DIAGNOSIS — I776 Arteritis, unspecified: Secondary | ICD-10-CM | POA: Diagnosis not present

## 2023-06-28 DIAGNOSIS — Z7960 Long term (current) use of unspecified immunomodulators and immunosuppressants: Secondary | ICD-10-CM | POA: Diagnosis not present

## 2023-06-28 DIAGNOSIS — M3130 Wegener's granulomatosis without renal involvement: Secondary | ICD-10-CM | POA: Diagnosis not present

## 2023-08-30 ENCOUNTER — Other Ambulatory Visit: Payer: Self-pay

## 2023-08-30 ENCOUNTER — Encounter: Payer: Self-pay | Admitting: Gastroenterology

## 2023-08-30 ENCOUNTER — Encounter: Payer: Self-pay | Admitting: Oncology

## 2023-09-06 ENCOUNTER — Encounter: Payer: Self-pay | Admitting: Oncology

## 2023-09-12 ENCOUNTER — Telehealth: Payer: Self-pay

## 2023-09-12 NOTE — Telephone Encounter (Signed)
 Auth Submission: PENDING Site of care: Site of care: [facility] Payer: [employer] Medication & CPT/J Code(s) submitted: Truxima  (Rituximab -abbs) [id number] Diagnosis Code:  Route of submission (phone, fax, portal): portal Phone # Fax # Auth type: Buy/Bill PB Units/visits requested: 500mg  q2weeks x2 doses, q6months (2000mg ) Reference number: [id number]/ [id number]

## 2023-09-13 ENCOUNTER — Inpatient Hospital Stay: Payer: 59 | Attending: Oncology | Admitting: Oncology

## 2023-09-13 ENCOUNTER — Encounter: Payer: Self-pay | Admitting: Oncology

## 2023-09-13 ENCOUNTER — Inpatient Hospital Stay: Payer: 59

## 2023-09-13 VITALS — BP 111/76 | HR 73 | Temp 97.0°F | Resp 18

## 2023-09-13 VITALS — BP 115/88 | HR 60 | Temp 97.6°F | Resp 18 | Wt 192.0 lb

## 2023-09-13 DIAGNOSIS — Z5112 Encounter for antineoplastic immunotherapy: Secondary | ICD-10-CM | POA: Insufficient documentation

## 2023-09-13 DIAGNOSIS — Z79620 Long term (current) use of immunosuppressive biologic: Secondary | ICD-10-CM | POA: Insufficient documentation

## 2023-09-13 DIAGNOSIS — M3130 Wegener's granulomatosis without renal involvement: Secondary | ICD-10-CM | POA: Insufficient documentation

## 2023-09-13 MED ORDER — DIPHENHYDRAMINE HCL 50 MG/ML IJ SOLN
50.0000 mg | Freq: Once | INTRAMUSCULAR | Status: AC
  Administered 2023-09-13: 25 mg via INTRAVENOUS
  Filled 2023-09-13: qty 1

## 2023-09-13 MED ORDER — ACETAMINOPHEN 325 MG PO TABS
650.0000 mg | ORAL_TABLET | Freq: Once | ORAL | Status: AC
  Administered 2023-09-13: 650 mg via ORAL
  Filled 2023-09-13: qty 2

## 2023-09-13 MED ORDER — SODIUM CHLORIDE 0.9 % IV SOLN
Freq: Once | INTRAVENOUS | Status: AC
Start: 2023-09-13 — End: 2023-09-13
  Filled 2023-09-13: qty 250

## 2023-09-13 MED ORDER — METHYLPREDNISOLONE SODIUM SUCC 125 MG IJ SOLR
100.0000 mg | Freq: Once | INTRAMUSCULAR | Status: AC
  Administered 2023-09-13: 100 mg via INTRAVENOUS
  Filled 2023-09-13: qty 2

## 2023-09-13 MED ORDER — SODIUM CHLORIDE 0.9 % IV SOLN
500.0000 mg | Freq: Once | INTRAVENOUS | Status: AC
  Administered 2023-09-13: 500 mg via INTRAVENOUS
  Filled 2023-09-13: qty 50

## 2023-09-13 NOTE — Patient Instructions (Signed)
Rituximab Injection What is this medication? RITUXIMAB (ri TUX i mab) treats leukemia and lymphoma. It works by blocking a protein that causes cancer cells to grow and multiply. This helps to slow or stop the spread of cancer cells. It may also be used to treat autoimmune conditions, such as arthritis. It works by slowing down an overactive immune system. It is a monoclonal antibody. This medicine may be used for other purposes; ask your health care provider or pharmacist if you have questions. COMMON BRAND NAME(S): RIABNI, Rituxan, RUXIENCE, truxima What should I tell my care team before I take this medication? They need to know if you have any of these conditions: Chest pain Heart disease Immune system problems Infection, such as chickenpox, cold sores, hepatitis B, herpes Irregular heartbeat or rhythm Kidney disease Low blood counts, such as low white cells, platelets, red cells Lung disease Recent or upcoming vaccine An unusual or allergic reaction to rituximab, other medications, foods, dyes, or preservatives Pregnant or trying to get pregnant Breast-feeding How should I use this medication? This medication is injected into a vein. It is given by a care team in a hospital or clinic setting. A special MedGuide will be given to you before each treatment. Be sure to read this information carefully each time. Talk to your care team about the use of this medication in children. While this medication may be prescribed for children as young as 6 months for selected conditions, precautions do apply. Overdosage: If you think you have taken too much of this medicine contact a poison control center or emergency room at once. NOTE: This medicine is only for you. Do not share this medicine with others. What if I miss a dose? Keep appointments for follow-up doses. It is important not to miss your dose. Call your care team if you are unable to keep an appointment. What may interact with this  medication? Do not take this medication with any of the following: Live vaccines This medication may also interact with the following: Cisplatin This list may not describe all possible interactions. Give your health care provider a list of all the medicines, herbs, non-prescription drugs, or dietary supplements you use. Also tell them if you smoke, drink alcohol, or use illegal drugs. Some items may interact with your medicine. What should I watch for while using this medication? Your condition will be monitored carefully while you are receiving this medication. You may need blood work while taking this medication. This medication can cause serious infusion reactions. To reduce the risk your care team may give you other medications to take before receiving this one. Be sure to follow the directions from your care team. This medication may increase your risk of getting an infection. Call your care team for advice if you get a fever, chills, sore throat, or other symptoms of a cold or flu. Do not treat yourself. Try to avoid being around people who are sick. Call your care team if you are around anyone with measles, chickenpox, or if you develop sores or blisters that do not heal properly. Avoid taking medications that contain aspirin, acetaminophen, ibuprofen, naproxen, or ketoprofen unless instructed by your care team. These medications may hide a fever. This medication may cause serious skin reactions. They can happen weeks to months after starting the medication. Contact your care team right away if you notice fevers or flu-like symptoms with a rash. The rash may be red or purple and then turn into blisters or peeling of the skin.  You may also notice a red rash with swelling of the face, lips, or lymph nodes in your neck or under your arms. In some patients, this medication may cause a serious brain infection that may cause death. If you have any problems seeing, thinking, speaking, walking, or  standing, tell your care team right away. If you cannot reach your care team, urgently seek another source of medical care. Talk to your care team if you may be pregnant. Serious birth defects can occur if you take this medication during pregnancy and for 12 months after the last dose. You will need a negative pregnancy test before starting this medication. Contraception is recommended while taking this medication and for 12 months after the last dose. Your care team can help you find the option that works for you. Do not breastfeed while taking this medication and for at least 6 months after the last dose. What side effects may I notice from receiving this medication? Side effects that you should report to your care team as soon as possible: Allergic reactions or angioedema--skin rash, itching or hives, swelling of the face, eyes, lips, tongue, arms, or legs, trouble swallowing or breathing Bowel blockage--stomach cramping, unable to have a bowel movement or pass gas, loss of appetite, vomiting Dizziness, loss of balance or coordination, confusion or trouble speaking Heart attack--pain or tightness in the chest, shoulders, arms, or jaw, nausea, shortness of breath, cold or clammy skin, feeling faint or lightheaded Heart rhythm changes--fast or irregular heartbeat, dizziness, feeling faint or lightheaded, chest pain, trouble breathing Infection--fever, chills, cough, sore throat, wounds that don't heal, pain or trouble when passing urine, general feeling of discomfort or being unwell Infusion reactions--chest pain, shortness of breath or trouble breathing, feeling faint or lightheaded Kidney injury--decrease in the amount of urine, swelling of the ankles, hands, or feet Liver injury--right upper belly pain, loss of appetite, nausea, light-colored stool, dark yellow or brown urine, yellowing skin or eyes, unusual weakness or fatigue Redness, blistering, peeling, or loosening of the skin, including  inside the mouth Stomach pain that is severe, does not go away, or gets worse Tumor lysis syndrome (TLS)--nausea, vomiting, diarrhea, decrease in the amount of urine, dark urine, unusual weakness or fatigue, confusion, muscle pain or cramps, fast or irregular heartbeat, joint pain Side effects that usually do not require medical attention (report to your care team if they continue or are bothersome): Headache Joint pain Nausea Runny or stuffy nose Unusual weakness or fatigue This list may not describe all possible side effects. Call your doctor for medical advice about side effects. You may report side effects to FDA at 1-800-FDA-1088. Where should I keep my medication? This medication is given in a hospital or clinic. It will not be stored at home. NOTE: This sheet is a summary. It may not cover all possible information. If you have questions about this medicine, talk to your doctor, pharmacist, or health care provider.  2023 Elsevier/Gold Standard (2021-06-09 00:00:00)

## 2023-09-13 NOTE — Progress Notes (Signed)
 Patient has been doing ok, no new questions for the doctor today.

## 2023-09-13 NOTE — Progress Notes (Signed)
 [facility]  [phone] [phone]) [phone]  ID: Thomas Gibbs OB: 01-11-1971  MR#: [id number]  RDW#:[id number]  Patient Care Team: Cleotilde Oneil FALCON, MD as PCP - General (Internal Medicine)  CHIEF COMPLAINT: Wegener's granulomatosis.   INTERVAL HISTORY: Patient returns to clinic today for routine 66-month evaluation and continuation of Ruxience .  He continues to feel well and remains asymptomatic.  He is tolerating his treatments without significant side effects.  He has no neurologic complaints.  He denies any recent fevers or illnesses.  He has a good appetite and denies weight loss.  He has no chest pain, shortness of breath, cough, or hemoptysis.  He denies any nausea, vomiting, constipation, or diarrhea.  He has no urinary complaints.  Patient feels at his baseline and offers no specific complaints today.  REVIEW OF SYSTEMS:   Review of Systems  Constitutional: Negative.  Negative for fever, malaise/fatigue and weight loss.  Respiratory: Negative.  Negative for cough, hemoptysis and shortness of breath.   Cardiovascular:  Negative for chest pain and leg swelling.  Gastrointestinal: Negative.  Negative for abdominal pain.  Genitourinary: Negative.  Negative for dysuria.  Musculoskeletal: Negative.  Negative for back pain.  Skin: Negative.  Negative for rash.  Neurological: Negative.  Negative for dizziness, focal weakness, weakness and headaches.  Psychiatric/Behavioral: Negative.  The patient is not nervous/anxious.     As per HPI. Otherwise, a complete review of systems is negative.  PAST MEDICAL HISTORY: Past Medical History:  Diagnosis Date   BMI 28.0-28.9,adult    Headache    sinus   Wegener's granulomatosis (granulomatosis with polyangiitis)     PAST SURGICAL HISTORY: Past Surgical History:  Procedure Laterality Date   BIOPSY  01/06/2022   Procedure: BIOPSY;  Surgeon: Wilhelmenia, Aloha Raddle., MD;  Location: [facility];  Service:  Gastroenterology;;   COLONOSCOPY WITH PROPOFOL  N/A 09/16/2020   Procedure: COLONOSCOPY WITH PROPOFOL ;  Surgeon: Jinny Carmine, MD;  Location: [facility];  Service: Endoscopy;  Laterality: N/A;   ENDOSCOPIC MUCOSAL RESECTION N/A 10/30/2020   Procedure: ENDOSCOPIC MUCOSAL RESECTION;  Surgeon: Wilhelmenia Aloha Raddle., MD;  Location: [facility] END[facility]COPY;  Service: Gastroenterology;  Laterality: N/A;   ETHMOIDECTOMY Bilateral 11/09/2018   Procedure: Total ETHMOIDECTOMY with sphenoidotomy-Left Total Ethmoidectomy with Sphenoidectomy (Tissue Removal)-Right;  Surgeon: Edda Mt, MD;  Location: [facility];  Service: ENT;  Laterality: Bilateral;   EUS N/A 10/30/2020   Procedure: LOWER ENDOSCOPIC ULTRASOUND (EUS);  Surgeon: Wilhelmenia Aloha Raddle., MD;  Location: [facility]COPY;  Service: Gastroenterology;  Laterality: N/A;   EUS N/A 01/06/2022   Procedure: LOWER ENDOSCOPIC ULTRASOUND (EUS);  Surgeon: Wilhelmenia Aloha Raddle., MD;  Location: [facility];  Service: Gastroenterology;  Laterality: N/A;   FLEXIBLE SIGMOIDOSCOPY N/A 09/25/2020   Procedure: FLEXIBLE SIGMOIDOSCOPY;  Surgeon: Jinny Carmine, MD;  Location: [facility] ENDOSCOPY;  Service: Endoscopy;  Laterality: N/A;   FLEXIBLE SIGMOIDOSCOPY N/A 10/30/2020   Procedure: FLEXIBLE SIGMOIDOSCOPY;  Surgeon: Wilhelmenia Aloha Raddle., MD;  Location: [facility] ENDOSCOPY;  Service: Gastroenterology;  Laterality: N/A;   FLEXIBLE SIGMOIDOSCOPY N/A 01/06/2022   Procedure: FLEXIBLE SIGMOIDOSCOPY;  Surgeon: Wilhelmenia Aloha Raddle., MD;  Location: [facility];  Service: Gastroenterology;  Laterality: N/A;   FRONTAL SINUS EXPLORATION Bilateral 11/09/2018   Procedure: FRONTAL SINUSotomy;  Surgeon: Edda Mt, MD;  Location: [facility];  Service: ENT;  Laterality: Bilateral;   HEMOSTASIS CLIP PLACEMENT  10/30/2020   Procedure: HEMOSTASIS CLIP PLACEMENT;  Surgeon: Wilhelmenia Aloha Raddle., MD;  Location: [facility] ENDOSCOPY;  Service: Gastroenterology;;   IMAGE GUIDED SINUS  SURGERY Bilateral 11/09/2018   Procedure: IMAGE GUIDED SINUS SURGERY;  Surgeon: Edda Mt, MD;  Location: [facility];  Service: ENT;  Laterality: Bilateral;  NEED STRYKER DISK   LUNG BIOPSY     MAXILLARY ANTROSTOMY Bilateral 11/09/2018   Procedure: MAXILLARY ANTROSTOMY with removal of tissue;  Surgeon: Edda Mt, MD;  Location: [facility];  Service: ENT;  Laterality: Bilateral;   SEPTOPLASTY Bilateral 11/09/2018   Procedure: SEPTOPLASTY;  Surgeon: Edda Mt, MD;  Location: [facility];  Service: ENT;  Laterality: Bilateral;  Gave disk to Erminio 9-30   SUBMUCOSAL LIFTING INJECTION  10/30/2020   Procedure: SUBMUCOSAL LIFTING INJECTION;  Surgeon: Wilhelmenia Aloha Raddle., MD;  Location: [facility] ENDOSCOPY;  Service: Gastroenterology;;   MONTA REDUCTION Bilateral 11/09/2018   Procedure: MONTA REDUCTION-inferior;  Surgeon: Edda Mt, MD;  Location: [facility];  Service: ENT;  Laterality: Bilateral;   VIDEO BRONCHOSCOPY WITH ENDOBRONCHIAL NAVIGATION N/A 12/22/2018   Procedure: VIDEO BRONCHOSCOPY WITH ENDOBRONCHIAL NAVIGATION;  Surgeon: Parris Manna, MD;  Location: [facility] ORS;  Service: Thoracic;  Laterality: N/A;   WISDOM TOOTH EXTRACTION      FAMILY HISTORY: History reviewed. No pertinent family history.  ADVANCED DIRECTIVES (Y/N):  N  HEALTH MAINTENANCE: Social History   Tobacco Use   Smoking status: Former    Current packs/day: 0.00    Types: Cigarettes    Start date: 57    Quit date: 2001    Years since quitting: 24.6   Smokeless tobacco: Former    Types: Chew    Quit date: 12/18/2017  Vaping Use   Vaping status: Never Used  Substance Use Topics   Alcohol use: Not Currently    Alcohol/week: 3.0 standard drinks of alcohol    Types: 3 Cans of beer per week    Comment: social   Drug use: Never     Colonoscopy:  PAP:  Bone density:  Lipid panel:  No Known Allergies  Current Outpatient Medications  Medication Sig Dispense  Refill   Fluticasone  Propionate (XHANCE ) 93 MCG/ACT EXHU Place 1 spray into both nostrils 2 (two) times daily.     gabapentin  (NEURONTIN ) 100 MG capsule Take 1-3 capsules (100-300 mg total) by mouth at bedtime. 90 capsule 11   Fluticasone  Propionate (XHANCE ) 93 MCG/ACT EXHU Place 1 spray into both nostrils 2 (two) times daily. (Patient not taking: Reported on 09/13/2023) 16 mL 11   gabapentin  (NEURONTIN ) 100 MG capsule Take 1-3 capsules (100-300 mg total) by mouth at bedtime. (Patient not taking: Reported on 09/13/2023) 90 capsule 11   methylPREDNISolone  (MEDROL  DOSEPAK) 4 MG TBPK tablet Take as directed per package instructions. (Patient not taking: Reported on 09/13/2023) 21 tablet 0   mupirocin  ointment (BACTROBAN ) 2 % Apply 1 Application topically 3 (three) times daily for 7 days. (Patient not taking: Reported on 09/13/2023) 22 g 0   mupirocin  ointment (BACTROBAN ) 2 % Apply topically 3 (three) times daily for 7 days (Patient not taking: Reported on 09/13/2023) 22 g 0   predniSONE  (DELTASONE ) 10 MG tablet Take 1 tablet (10 mg total) by mouth daily. (Patient not taking: Reported on 03/16/2023) 10 tablet 0   sertraline  (ZOLOFT ) 50 MG tablet Take 1 tablet (50 mg total) by mouth daily. (Patient not taking: Reported on 09/13/2023) 90 tablet 3   sertraline  (ZOLOFT ) 50 MG tablet Take 1 tablet (50 mg total) by mouth daily. (Patient not taking: Reported on 09/13/2023) 90 tablet 3   No current facility-administered medications for this visit.    OBJECTIVE: Vitals:  09/13/23 0907  BP: 115/88  Pulse: 60  Resp: 18  Temp: 97.6 F (36.4 C)  SpO2: 100%     Body mass index is 27.55 kg/m.    ECOG FS:0 - Asymptomatic  General: Well-developed, well-nourished, no acute distress. Eyes: Pink conjunctiva, anicteric sclera. HEENT: Normocephalic, moist mucous membranes. Lungs: No audible wheezing or coughing. Heart: Regular rate and rhythm. Abdomen: Soft, nontender, no obvious distention. Musculoskeletal: No edema,  cyanosis, or clubbing. Neuro: Alert, answering all questions appropriately. Cranial nerves grossly intact. Skin: No rashes or petechiae noted. Psych: Normal affect.  LAB RESULTS:  No results found for: NA, K, CL, CO2, GLUCOSE, BUN, CREATININE, CALCIUM, PROT, ALBUMIN, AST, ALT, ALKPHOS, BILITOT, GFRNONAA, GFRAA  Lab Results  Component Value Date   WBC 10.2 12/19/2018   HGB 12.4 (L) 12/19/2018   HCT 39.5 12/19/2018   MCV 92.7 12/19/2018   PLT 400 12/19/2018     STUDIES: No results found.  ASSESSMENT: Wegener's granulomatosis.   PLAN:   Wegener's granulomatosis: Chronic and unchanged.  Proceed with 500 mg maintenance IV Ruxience  today.  Patient expressed understanding that all laboratory work and any symptoms related to his Wegener's will be monitored and evaluated by Dr. Tobie in Rheumatology.  No further intervention is needed.  Proceed with treatment today.  Return to clinic in 6 months for further evaluation and continuation of treatment.  Follow-up with rheumatology as scheduled.  I spent a total of 30 minutes reviewing chart data, face-to-face evaluation with the patient, counseling and coordination of care as detailed above.   Patient expressed understanding and was in agreement with this plan. He also understands that He can call clinic at any time with any questions, concerns, or complaints.    Evalene JINNY Reusing, MD   09/13/2023 9:13 AM

## 2023-09-16 ENCOUNTER — Telehealth: Payer: Self-pay

## 2023-09-16 NOTE — Telephone Encounter (Signed)
 error

## 2023-11-23 ENCOUNTER — Encounter (INDEPENDENT_AMBULATORY_CARE_PROVIDER_SITE_OTHER): Payer: Self-pay | Admitting: Gastroenterology

## 2023-11-25 ENCOUNTER — Telehealth: Payer: Self-pay | Admitting: *Deleted

## 2023-11-25 ENCOUNTER — Telehealth: Payer: Self-pay | Admitting: Pharmacy Technician

## 2023-11-25 NOTE — Telephone Encounter (Signed)
 The patient called in saying that he is trying to get in touch with the financial people for co-pay because he is taking  RUXIENCE ,   for Granulomatosis with polyangiitis, unspecified .

## 2023-11-25 NOTE — Telephone Encounter (Signed)
 Received message that patient was asking for copay assistance for Truxima .  Spoke with the pharmacy team.  Patient is already on copay assistance for this medication.  Pharmacy team is going to research and make sure patient's account is getting credited.  [name] Patient Pharmacologist [facility]

## 2023-11-30 ENCOUNTER — Telehealth: Payer: Self-pay | Admitting: Pharmacy Technician

## 2023-11-30 NOTE — Telephone Encounter (Signed)
 Patient called inquiring about if the cost of his Truxima  had been filed for reimbursement on his copay assistance plan.  Reached out to the pharmacy team.  His claim has been submitted and are waiting on a check in the amount of $5,530.34 to be sent to post against patient's account for August DOS.  I contacted patient and made aware.  Patient asked for contact information for [employer].  Provided phone number to patient.  Dickey DOROTHA Fritter Patient Pharmacologist [facility]

## 2023-12-09 ENCOUNTER — Other Ambulatory Visit (HOSPITAL_COMMUNITY): Payer: Self-pay

## 2023-12-09 ENCOUNTER — Encounter: Payer: Self-pay | Admitting: Oncology

## 2023-12-09 ENCOUNTER — Telehealth: Payer: Self-pay | Admitting: Gastroenterology

## 2023-12-09 NOTE — Telephone Encounter (Signed)
 PT is calling to schedule and EUS. Please advise of available dates.

## 2023-12-12 NOTE — Telephone Encounter (Signed)
 Lower Eus has been entered for 01/16/24 at 8 am at [facility] with GM  Lower EUS scheduled, pt instructed and medications reviewed.  Patient instructions mailed to home.  Patient to call with any questions or concerns.

## 2023-12-29 NOTE — Progress Notes (Signed)
 Rheumatology Follow Up Note  Chief Complaint  Patient presents with  . Granulomatosis with polyangiitis with vasculitis (CMS/HHS-H      Subjective:HPI  Thomas Gibbs is a 53 y.o. male is here today for follow up of granulomatosis with polyangiitis. The patient's allergies, current medications, past family history, past medical history, past social history, past surgical history and problem list were reviewed and updated as appropriate.   He is taking the Rituxan . He is tolerating this well. He has no recurrent sinus disease. He states that he feels well. He has no joint pains or swelling. He has noticed shortness of breath that is rare. He has fever, no nasal/oral ulcer, wrist or foot drop or new skin rash. He denies any bloody in the stools.   Review of Systems:   Review of Systems  Constitutional:  Negative for fatigue.  HENT:  Negative for mouth sores and trouble swallowing.        Neg: Dry Mouth  Eyes:  Negative for redness.       Neg: Dry Eyes  Respiratory:  Negative for cough and shortness of breath.   Cardiovascular:  Negative for chest pain and leg swelling.  Gastrointestinal:  Negative for constipation, diarrhea and nausea.  Endocrine: Negative for cold intolerance and heat intolerance.  Genitourinary:  Negative for hematuria.  Musculoskeletal:        Per HPI  Skin:  Negative for color change and rash.  Neurological:  Negative for dizziness, weakness, numbness and headaches.  Hematological:  Does not bruise/bleed easily.  Psychiatric/Behavioral:  Negative for dysphoric mood and sleep disturbance. The patient is not nervous/anxious.   All other systems reviewed and are negative.   Objective:  Vitals:   12/29/23 1503  BP: 101/71  Pulse: 71  Temp: 36.7 C (98.1 F)  TempSrc: Temporal  Weight: 88.5 kg (195 lb)  Height: 177.8 cm (5' 10)  PainSc: 0-No pain     Length of Stiffness: No Stiffness  GEN - Pleasant, No Apparent Distress  HEENT - normocephalic and  atraumatic. Conjunctiva Clear.  No Nasal/Oral Ulcer. Adequate Saliva Neck - supple with no adenopathy or thyromegaly.   C spine with full range of motion. Heart - regular rate and rhythm, No murmurs/gallops/rub, Nml S1S2 Lungs - clear to auscultation in all fields. Extremities - there is no cyanosis or edema.  Neurological - alert and oriented.  Spine - no paraspinal tenderness; no L spine tenderness Skin - no rashes observed MSK - The following joints were examined bilaterally: Hands, Wrists, Elbows, Shoulders, Metatarsals, Ankles, Knees and Hips; they were normal apart from what is noted.   100% Fist Formation PIP Enlargement 3rd MCP Palmar Thickening/Tendon  No Synovitis or Dactylitis Strength 5/5 Proximal and Distal, Upper and Lower Extremity No Tender Point  Labs/Imaging Reviewed in EMR  Cr 1.0, AST 16, ALT 16 Normal CBC HgA1c 5.9 TSH 2.8 CK 118 UA: No Blood or Protein Upr/cr 0.56 ESR 2 Vitamin B12 258 (L) UA with no RBCs or Protein  CT Chest WO Contraast (07/2019) 1. Interval development of volume loss in the RIGHT upper lobe along  the mediastinal border. The central nodular area seen on the  previous exam is improved. No visible cavitation in this location on  the current study.  2. Small areas of ground-glass and subtle nodularity in the RIGHT  upper lobe peripheral to the area of concern seen on the prior  imaging study and lateral to collapsed lung along the RIGHT  mediastinal border. Findings  may be related to postobstructive  pneumonitis given filling of airways subtending this area in the  RIGHT upper lobe. Developing nodules in the setting of Wegner's is  also a diagnostic possibility.    Assessment and Plan   Granulomatous with polyangiitis: Stable -- Pos cANCA 1:80, positive PR3 71, positive sinus and lung biopsy -- Abnormal chest CT with narrowing of the bronchi right upper lobe -- We did discuss PJP Prophylaxis with Bactrim.  -- Continue Rituxan   500 mg every six months (Last Infusion in Aug 05,2025)  2. Long term use of immunosuppressive medication -- Rituxan  is an immunosuppressive medication that require drug toxicity monitoring -- Patient has been educated on the importance of keeping follow up appointments for disease and medication monitoring.  -- Check Labs   Diagnoses and all orders for this visit:  Granulomatosis with polyangiitis with vasculitis (CMS/HHS-HCC) -     CBC w/auto Differential (5 Part) -     Comprehensive Metabolic Panel (CMP) -     Urinalysis w/Microscopic -     Sedimentation Rate-Westergren - [facility] -     Creatine Kinase (CK), Total -     Immunoglobulins A/E/G/M, Serum - [facility]  Long-term use of immunosuppressant medication -     CBC w/auto Differential (5 Part) -     Comprehensive Metabolic Panel (CMP)      Return in about 6 months (around 06/27/2024) for Routine Follow Up.   All new prescription medications, changes in current prescription dosages, and sample medications were discussed with the patient, including patient education, medication name, use, dosage, potential side effects, drug interactions, consequences of not using/taking, and special instructions.  Patient expressed understanding.  No barriers to adherence.   I appreciate the opportunity to participate in the care of Thomas Gibbs. Please do not hesitate to contact me with any questions or concerns that may arise in regards to the patient's rheumatologic disease.    Attestation Statement:   I personally performed the service. (TP)  Thomas LOREE BLANCH, MD

## 2024-01-06 ENCOUNTER — Encounter (HOSPITAL_COMMUNITY): Payer: Self-pay | Admitting: Gastroenterology

## 2024-01-06 NOTE — Progress Notes (Signed)
 Attempted to obtain medical history for pre op call via telephone, unable to reach at this time. HIPAA compliant voicemail message left requesting return call to pre surgical testing department.

## 2024-01-09 ENCOUNTER — Telehealth: Payer: Self-pay | Admitting: Gastroenterology

## 2024-01-09 NOTE — Telephone Encounter (Addendum)
 Procedure:Upper EUS Procedure date: 01/16/24 Procedure location: [facility] Arrival Time: 6:30 am Spoke with the patient Y/N: Yes Any prep concerns? No  Has the patient obtained the prep from the pharmacy ? No prep needed Do you have a care partner and transportation: Yes Any additional concerns? No

## 2024-01-11 ENCOUNTER — Other Ambulatory Visit (HOSPITAL_COMMUNITY): Payer: Self-pay

## 2024-01-11 MED ORDER — FLUOROURACIL 5 % EX CREA
TOPICAL_CREAM | Freq: Two times a day (BID) | CUTANEOUS | 1 refills | Status: AC
  Filled 2024-01-11: qty 40, 30d supply, fill #0

## 2024-01-14 NOTE — Anesthesia Preprocedure Evaluation (Signed)
 Anesthesia Evaluation  Patient identified by MRN, date of birth, ID band Patient awake  General Assessment Comment:Wegener's granulomatosis (granulomatosis with polyangiitis)   Reviewed: Allergy & Precautions, NPO status , Patient's Chart, lab work & pertinent test results  Airway Mallampati: II  TM Distance: >3 FB Neck ROM: Full    Dental  (+) Dental Advisory Given, Missing   Pulmonary former smoker   Pulmonary exam normal breath sounds clear to auscultation       Cardiovascular negative cardio ROS Normal cardiovascular exam Rhythm:Regular Rate:Normal     Neuro/Psych negative neurological ROS     GI/Hepatic Neg liver ROS,,,Rectal abnormality   Endo/Other  negative endocrine ROS    Renal/GU negative Renal ROS     Musculoskeletal negative musculoskeletal ROS (+)    Abdominal   Peds  Hematology negative hematology ROS (+)   Anesthesia Other Findings   Reproductive/Obstetrics                              Anesthesia Physical Anesthesia Plan  ASA: 2  Anesthesia Plan: MAC   Post-op Pain Management: Minimal or no pain anticipated   Induction: Intravenous  PONV Risk Score and Plan: 1 and TIVA and Treatment may vary due to age or medical condition  Airway Management Planned: Natural Airway and Simple Face Mask  Additional Equipment:   Intra-op Plan:   Post-operative Plan:   Informed Consent: I have reviewed the patients History and Physical, chart, labs and discussed the procedure including the risks, benefits and alternatives for the proposed anesthesia with the patient or authorized representative who has indicated his/her understanding and acceptance.     Dental advisory given  Plan Discussed with: CRNA  Anesthesia Plan Comments:          Anesthesia Quick Evaluation

## 2024-01-16 ENCOUNTER — Telehealth: Payer: Self-pay

## 2024-01-16 ENCOUNTER — Ambulatory Visit (HOSPITAL_COMMUNITY)
Admission: RE | Admit: 2024-01-16 | Discharge: 2024-01-16 | Disposition: A | Attending: Gastroenterology | Admitting: Gastroenterology

## 2024-01-16 ENCOUNTER — Other Ambulatory Visit: Payer: Self-pay

## 2024-01-16 ENCOUNTER — Encounter (HOSPITAL_COMMUNITY): Admission: RE | Disposition: A | Payer: Self-pay | Source: Home / Self Care | Attending: Gastroenterology

## 2024-01-16 ENCOUNTER — Ambulatory Visit (HOSPITAL_COMMUNITY): Payer: Self-pay | Admitting: Registered Nurse

## 2024-01-16 ENCOUNTER — Encounter (HOSPITAL_COMMUNITY): Payer: Self-pay | Admitting: Gastroenterology

## 2024-01-16 ENCOUNTER — Encounter (HOSPITAL_COMMUNITY): Payer: Self-pay | Admitting: Registered Nurse

## 2024-01-16 DIAGNOSIS — D3A026 Benign carcinoid tumor of the rectum: Secondary | ICD-10-CM

## 2024-01-16 HISTORY — PX: COLONOSCOPY: SHX5424

## 2024-01-16 HISTORY — PX: EUS: SHX5427

## 2024-01-16 SURGERY — COLONOSCOPY
Anesthesia: Monitor Anesthesia Care

## 2024-01-16 MED ORDER — SODIUM CHLORIDE 0.9 % IV SOLN
INTRAVENOUS | Status: AC | PRN
  Administered 2024-01-16: 500 mL via INTRAVENOUS

## 2024-01-16 MED ORDER — SODIUM CHLORIDE 0.9 % IV SOLN: INTRAVENOUS | Status: DC

## 2024-01-16 MED ORDER — PROPOFOL 500 MG/50ML IV EMUL
INTRAVENOUS | Status: AC
  Filled 2024-01-16: qty 50

## 2024-01-16 MED ORDER — PROPOFOL 500 MG/50ML IV EMUL
INTRAVENOUS | Status: DC | PRN
  Administered 2024-01-16 (×2): 20 mg via INTRAVENOUS
  Administered 2024-01-16: 140 ug/kg/min via INTRAVENOUS
  Administered 2024-01-16: 20 mg via INTRAVENOUS
  Administered 2024-01-16 (×2): 30 mg via INTRAVENOUS

## 2024-01-16 MED ORDER — PROPOFOL 1000 MG/100ML IV EMUL
INTRAVENOUS | Status: AC
  Filled 2024-01-16: qty 100

## 2024-01-16 MED ORDER — LIDOCAINE 2% (20 MG/ML) 5 ML SYRINGE
INTRAMUSCULAR | Status: DC | PRN
  Administered 2024-01-16: 60 mg via INTRAVENOUS

## 2024-01-16 MED ORDER — EPHEDRINE SULFATE-NACL 50-0.9 MG/10ML-% IV SOSY
PREFILLED_SYRINGE | INTRAVENOUS | Status: DC | PRN
  Administered 2024-01-16 (×2): 5 mg via INTRAVENOUS

## 2024-01-16 NOTE — Telephone Encounter (Signed)
 Recalls have been entered as ordered

## 2024-01-16 NOTE — Anesthesia Procedure Notes (Signed)
 Procedure Name: MAC Date/Time: 01/16/2024 7:59 AM  Performed by: Memory Armida LABOR, CRNAPre-anesthesia Checklist: Patient identified, Emergency Drugs available, Suction available, Patient being monitored and Timeout performed Patient Re-evaluated:Patient Re-evaluated prior to induction Oxygen Delivery Method: Simple face mask Placement Confirmation: positive ETCO2 Dental Injury: Teeth and Oropharynx as per pre-operative assessment

## 2024-01-16 NOTE — Transfer of Care (Signed)
 Immediate Anesthesia Transfer of Care Note  Patient: Thomas Gibbs  Procedure(s) Performed: ULTRASOUND, LOWER GI TRACT, ENDOSCOPIC COLONOSCOPY  Patient Location: PACU and Endoscopy Unit  Anesthesia Type:MAC  Level of Consciousness: drowsy and patient cooperative  Airway & Oxygen Therapy: Patient Spontanous Breathing and Patient connected to face mask oxygen  Post-op Assessment: Report given to RN, Post -op Vital signs reviewed and stable, and Patient moving all extremities  Post vital signs: Reviewed and stable  Last Vitals:  Vitals Value Taken Time  BP    Temp    Pulse 73 01/16/24 08:38  Resp 17 01/16/24 08:38  SpO2 99 % 01/16/24 08:38  Vitals shown include unfiled device data.  Last Pain:  Vitals:   01/16/24 0656  TempSrc: Temporal  PainSc: 0-No pain      Patients Stated Pain Goal: 0 (01/16/24 0656)  Complications: No notable events documented.

## 2024-01-16 NOTE — Telephone Encounter (Signed)
-----   Message from [facility] sent at 01/16/2024  8:49 AM EST ----- Regarding: Followup Thomas Gibbs, Patient needs Lower EUS in 2-years. Patient needs full colonoscopy in next 6-12 months (he would like to try and do in March since he will meet his full deductible by then, so I am OK with recall at that time. For his colonoscopy, he can have done at the [facility]. Thanks. GM

## 2024-01-16 NOTE — Discharge Instructions (Signed)

## 2024-01-16 NOTE — H&P (Signed)
 GASTROENTEROLOGY PROCEDURE H&P NOTE   Primary Care Physician: Cleotilde Oneil FALCON, MD  HPI: Thomas Gibbs is a 53 y.o. male  Past Medical History:  Diagnosis Date   BMI 28.0-28.9,adult    Headache    sinus   Wegener's granulomatosis (granulomatosis with polyangiitis)    Past Surgical History:  Procedure Laterality Date   BIOPSY  01/06/2022   Procedure: BIOPSY;  Surgeon: Wilhelmenia Aloha Raddle., MD;  Location: [facility];  Service: Gastroenterology;;   COLONOSCOPY WITH PROPOFOL  N/A 09/16/2020   Procedure: COLONOSCOPY WITH PROPOFOL ;  Surgeon: Jinny Carmine, MD;  Location: [facility]COPY;  Service: Endoscopy;  Laterality: N/A;   ENDOSCOPIC MUCOSAL RESECTION N/A 10/30/2020   Procedure: ENDOSCOPIC MUCOSAL RESECTION;  Surgeon: Wilhelmenia Aloha Raddle., MD;  Location: [facility] ENDOSCOPY;  Service: Gastroenterology;  Laterality: N/A;   ETHMOIDECTOMY Bilateral 11/09/2018   Procedure: Total ETHMOIDECTOMY with sphenoidotomy-Left Total Ethmoidectomy with Sphenoidectomy (Tissue Removal)-Right;  Surgeon: Edda Mt, MD;  Location: [facility];  Service: ENT;  Laterality: Bilateral;   EUS N/A 10/30/2020   Procedure: LOWER ENDOSCOPIC ULTRASOUND (EUS);  Surgeon: Wilhelmenia Aloha Raddle., MD;  Location: [facility] ENDOSCOPY;  Service: Gastroenterology;  Laterality: N/A;   EUS N/A 01/06/2022   Procedure: LOWER ENDOSCOPIC ULTRASOUND (EUS);  Surgeon: Wilhelmenia Aloha Raddle., MD;  Location: [facility];  Service: Gastroenterology;  Laterality: N/A;   FLEXIBLE SIGMOIDOSCOPY N/A 09/25/2020   Procedure: FLEXIBLE SIGMOIDOSCOPY;  Surgeon: Jinny Carmine, MD;  Location: [facility] ENDOSCOPY;  Service: Endoscopy;  Laterality: N/A;   FLEXIBLE SIGMOIDOSCOPY N/A 10/30/2020   Procedure: FLEXIBLE SIGMOIDOSCOPY;  Surgeon: Wilhelmenia Aloha Raddle., MD;  Location: [facility] END[facility]COPY;  Service: Gastroenterology;  Laterality: N/A;   FLEXIBLE SIGMOIDOSCOPY N/A 01/06/2022   Procedure: FLEXIBLE SIGMOIDOSCOPY;  Surgeon: Wilhelmenia Aloha Raddle.,  MD;  Location: [facility];  Service: Gastroenterology;  Laterality: N/A;   FRONTAL SINUS EXPLORATION Bilateral 11/09/2018   Procedure: FRONTAL SINUSotomy;  Surgeon: Edda Mt, MD;  Location: [facility];  Service: ENT;  Laterality: Bilateral;   HEMOSTASIS CLIP PLACEMENT  10/30/2020   Procedure: HEMOSTASIS CLIP PLACEMENT;  Surgeon: Wilhelmenia Aloha Raddle., MD;  Location: [facility] ENDOSCOPY;  Service: Gastroenterology;;   IMAGE GUIDED SINUS SURGERY Bilateral 11/09/2018   Procedure: IMAGE GUIDED SINUS SURGERY;  Surgeon: Edda Mt, MD;  Location: [facility];  Service: ENT;  Laterality: Bilateral;  NEED STRYKER DISK   LUNG BIOPSY     MAXILLARY ANTROSTOMY Bilateral 11/09/2018   Procedure: MAXILLARY ANTROSTOMY with removal of tissue;  Surgeon: Edda Mt, MD;  Location: [facility];  Service: ENT;  Laterality: Bilateral;   SEPTOPLASTY Bilateral 11/09/2018   Procedure: SEPTOPLASTY;  Surgeon: Edda Mt, MD;  Location: [facility]ERY [facility];  Service: ENT;  Laterality: Bilateral;  Gave disk to Thomas Gibbs 9-30   SUBMUCOSAL LIFTING INJECTION  10/30/2020   Procedure: SUBMUCOSAL LIFTING INJECTION;  Surgeon: Wilhelmenia Aloha Raddle., MD;  Location: [facility] ENDOSCOPY;  Service: Gastroenterology;;   MONTA REDUCTION Bilateral 11/09/2018   Procedure: MONTA REDUCTION-inferior;  Surgeon: Edda Mt, MD;  Location: [facility]GERY [facility]TR;  Service: ENT;  Laterality: Bilateral;   VIDEO BRONCHOSCOPY WITH ENDOBRONCHIAL NAVIGATION N/A 12/22/2018   Procedure: VIDEO BRONCHOSCOPY WITH ENDOBRONCHIAL NAVIGATION;  Surgeon: Parris Manna, MD;  Location: [facility] ORS;  Service: Thoracic;  Laterality: N/A;   WISDOM TOOTH EXTRACTION     Current Facility-Administered Medications  Medication Dose Route Frequency Provider Last Rate Last Admin   0.9 %  sodium chloride  infusion    Continuous PRN Mansouraty, Vangie Henthorn Jr., MD 20 mL/hr at 01/16/24 9287 Continued from Pre-op at  01/16/24 0712   0.9 %  sodium chloride   infusion   Intravenous Continuous Mansouraty, Jermayne Sweeney Jr., MD        Current Facility-Administered Medications:    0.9 %  sodium chloride  infusion, , , Continuous PRN, Mansouraty, Aloha Raddle., MD, Last Rate: 20 mL/hr at 01/16/24 0712, Continued from Pre-op at 01/16/24 0712   0.9 %  sodium chloride  infusion, , Intravenous, Continuous, Mansouraty, Aloha Raddle., MD No Known Allergies History reviewed. No pertinent family history. Social History   Socioeconomic History   Marital status: Married    Spouse name: Not on file   Number of children: Not on file   Years of education: Not on file   Highest education level: Not on file  Occupational History   Not on file  Tobacco Use   Smoking status: Former    Current packs/day: 0.00    Types: Cigarettes    Start date: 95    Quit date: 2001    Years since quitting: 24.9   Smokeless tobacco: Former    Types: Chew    Quit date: 12/18/2017  Vaping Use   Vaping status: Never Used  Substance and Sexual Activity   Alcohol use: Not Currently    Alcohol/week: 3.0 standard drinks of alcohol    Types: 3 Cans of beer per week    Comment: social   Drug use: Never   Sexual activity: Yes  Other Topics Concern   Not on file  Social History Narrative   Not on file   Social Drivers of Health   Financial Resource Strain: Low Risk  (04/04/2023)   Received from [facility]   Overall Financial Resource Strain (CARDIA)    Difficulty of Paying Living Expenses: Not hard at all  Food Insecurity: No Food Insecurity (04/04/2023)   Received from [facility]   Hunger Vital Sign    Within the past 12 months, you worried that your food would run out before you got the money to buy more.: Never true    Within the past 12 months, the food you bought just didn't last and you didn't have money to get more.: Never true  Transportation Needs: No Transportation Needs (04/04/2023)   Received from [facility] - Transportation    In the past 12 months, has lack of transportation kept you from medical appointments or from getting medications?: No    Lack of Transportation (Non-Medical): No  Physical Activity: Not on file  Stress: Not on file  Social Connections: Not on file  Intimate Partner Violence: Not on file    Physical Exam: Today's Vitals   01/06/24 1525 01/16/24 0656  BP:  103/80  Resp:  12  Temp:  97.6 F (36.4 C)  TempSrc:  Temporal  SpO2:  95%  Weight: 87 kg 89.1 kg  Height: 5' 10 (1.778 m) 5' 10 (1.778 m)  PainSc:  0-No pain   Body mass index is 28.18 kg/m. GEN: NAD EYE: Sclerae anicteric ENT: MMM CV: Non-tachycardic GI: Soft, NT/ND NEURO:  Alert & Oriented x 3  Lab Results: No results for input(s): WBC, HGB, HCT, PLT in the last 72 hours. BMET No results for input(s): NA, K, CL, CO2, GLUCOSE, BUN, CREATININE, CALCIUM in the last 72 hours. LFT No results for input(s): PROT, ALBUMIN, AST, ALT, ALKPHOS, BILITOT, BILIDIR, IBILI in the last 72 hours. PT/INR No results for input(s): LABPROT, INR in the last 72 hours.   Impression /  Plan: This is a 53 y.o.male who presents for Colonoscopy with lower EUS for Colon polyp surveillance and Rectal NET followup.  The risks of an EUS including intestinal perforation, bleeding, infection, aspiration, and medication effects were discussed as was the possibility it may not give a definitive diagnosis if a biopsy is performed.     The risks and benefits of endoscopic evaluation/treatment were discussed with the patient and/or family; these include but are not limited to the risk of perforation, infection, bleeding, missed lesions, lack of diagnosis, severe illness requiring hospitalization, as well as anesthesia and sedation related illnesses.  The patient's history has been reviewed, patient examined, no change in status, and deemed stable for procedure.  The patient and/or  family was provided an opportunity to ask questions and all were answered.  The patient and/or family is agreeable to proceed.    Aloha Finner, MD [facility] Office # [phone]

## 2024-01-16 NOTE — Op Note (Addendum)
 [facility] Patient Name: Thomas Gibbs Procedure Date: 01/16/2024 MRN: [id number] Attending MD: Aloha Finner , MD, [id number] Date of Birth: 1970/02/27 CSN: [id number] Age: 53 Admit Type: Outpatient Procedure:                Lower EUS Indications:              Post-op endosonographic surveillance for potential                            anorectal carcinoma recurrence, Neuroendocrine Tumor Providers:                Aloha Finner, MD, Randall Lines, RN, [facility]                            [place], Technician, [place] [employer]. [place], CRNA Referring MD:              Medicines:                Monitored Anesthesia Care Complications:            No immediate complications. Estimated Blood Loss:     Estimated blood loss was minimal. Estimated blood                            loss: none. Procedure:                Pre-Anesthesia Assessment:                           - Prior to the procedure, a History and Physical                            was performed, and patient medications and                            allergies were reviewed. The patient's tolerance of                            previous anesthesia was also reviewed. The risks                            and benefits of the procedure and the sedation                            options and risks were discussed with the patient.                            All questions were answered, and informed consent                            was obtained. Prior Anticoagulants: The patient has                            taken no anticoagulant or antiplatelet agents. ASA  Grade Assessment: II - A patient with mild systemic                            disease. After reviewing the risks and benefits,                            the patient was deemed in satisfactory condition to                            undergo the procedure.                           After obtaining informed consent, the endoscope was                             passed under direct vision. Throughout the                            procedure, the patient's blood pressure, pulse, and                            oxygen saturations were monitored continuously. The                            CF-HQ190L ([id number]) Olympus colonoscope was                            introduced through the mouth, and advanced to the                            the cecum, identified by appendiceal orifice and                            ileocecal valve. The RADIAL EUS ( [id number] ) [id number]                            was introduced through the anus and advanced to the                            the sigmoid colon for ultrasound. After obtaining                            informed consent, the endoscope was passed under                            direct vision. Throughout the procedure, the                            patient's blood pressure, pulse, and oxygen                            saturations were monitored continuously.The lower  EUS was accomplished without difficulty. The                            patient tolerated the procedure. The quality of the                            bowel preparation was adequate. Scope In: 8:08:00 AM Scope Out: 8:29:52 AM Scope Withdrawal Time: 0 hours 18 minutes 24 seconds  Total Procedure Duration: 0 hours 21 minutes 52 seconds  Findings:      The digital rectal exam findings include hemorrhoids. Pertinent       negatives include no palpable rectal lesions.      ENDOSCOPIC FINDING: :      A large amount of semi-liquid stool was found in the entire colon,       interfering with visualization. Lavage of the area was performed using       copious amounts, resulting in clearance with adequate visualization.      Multiple small-mouthed diverticula were found in the recto-sigmoid colon       and sigmoid colon.      A medium post mucosectomy scar was found in the distal rectum with       tattoo  in place. There was no evidence of the previous polyp.      Non-bleeding non-thrombosed internal hemorrhoids were found during       retroflexion, during perianal exam and during digital exam. The       hemorrhoids were Grade II (internal hemorrhoids that prolapse but reduce       spontaneously).      ENDOSONOGRAPHIC FINDING: :      Local wall thickening was found in the rectum. This was encountered from       4 to 5 cm (from the anal verge). This finding appeared to be primarily       due to increased thickness of the deep mucosa (Layer 2) and submucosa       (Layer 3). This is at scar site.      The rectum was otherwise normal.      The perirectal space was normal.      The rectosigmoid junction and sigmoid colon were normal.      No malignant-appearing lymph nodes were visualized in the perirectal       region and in the left iliac region. The nodes were.      The internal anal sphincter was visualized endosonographically and       appeared normal. Impression:               FLEX Impression:                           - Hemorrhoids found on digital rectal exam.                           - Stool in the entire examined colon.                           - Diverticulosis in the recto-sigmoid colon and in                            the sigmoid colon.                           -  Post mucosectomy scar with tattoo in place in the                            distal rectum (@ 5cm).                           - Non-bleeding non-thrombosed internal hemorrhoids.                           EUS Impression:                           - Local wall thickening was visualized                            endosonographically in the rectum. This was due to                            increased thickness of the deep mucosa (Layer 2)                            and submucosa (Layer 3). This is at scar site.                           - Endosonographic images of the rectum were                            otherwise  unremarkable.                           - Endosonographic images of the perirectal space                            were unremarkable.                           - Endosonographic imaging showed no sign of                            significant pathology at the rectosigmoid junction                            and in the sigmoid colon.                           - No malignant-appearing lymph nodes were                            visualized endosonographically in the perirectal                            region and in the left iliac region.                           - The internal anal sphincter was visualized  endosonographically and appeared normal. Moderate Sedation:      Not Applicable - Patient had care per Anesthesia. Recommendation:           - The patient will be observed post-procedure,                            until all discharge criteria are met.                           - Discharge patient to home.                           - Patient has a contact number available for                            emergencies. The signs and symptoms of potential                            delayed complications were discussed with the                            patient. Return to normal activities tomorrow.                            Written discharge instructions were provided to the                            patient.                           - High fiber diet.                           - Continue present medications.                           - Patient will need full colonoscopy within the                            next year.                           - Last Lower EUS for surveillance of prior Rectal                            NET would be recommended in 2-years.                           - The findings and recommendations were discussed                            with the patient.                           - The findings and recommendations were discussed  with the patient's family. Procedure Code(s):        --- Professional ---                           [phone], Colonoscopy, flexible; with endoscopic                            ultrasound examination limited to the rectum,                            sigmoid, descending, transverse, or ascending colon                            and cecum, and adjacent structures Diagnosis Code(s):        --- Professional ---                           K64.1, Second degree hemorrhoids                           Z98.890, Other specified postprocedural states                           K62.89, Other specified diseases of anus and rectum                           I89.9, Noninfective disorder of lymphatic vessels                            and lymph nodes, unspecified                           Z08, Encounter for follow-up examination after                            completed treatment for malignant neoplasm                           Z85.048, Personal history of other malignant                            neoplasm of rectum, rectosigmoid junction, and anus                           K57.30, Diverticulosis of large intestine without                            perforation or abscess without bleeding CPT copyright 2022 American [employer] Association. All rights reserved. The codes documented in this report are preliminary and upon coder review may  be revised to meet current compliance requirements. Aloha Finner, MD 01/16/2024 8:38:58 AM Number of Addenda: 0

## 2024-01-17 ENCOUNTER — Other Ambulatory Visit (HOSPITAL_COMMUNITY): Payer: Self-pay

## 2024-01-17 ENCOUNTER — Encounter: Payer: Self-pay | Admitting: Oncology

## 2024-01-17 NOTE — Anesthesia Postprocedure Evaluation (Signed)
 Anesthesia Post Note  Patient: Thomas Gibbs  Procedure(s) Performed: ULTRASOUND, LOWER GI TRACT, ENDOSCOPIC COLONOSCOPY     Patient location during evaluation: Endoscopy Anesthesia Type: MAC Level of consciousness: oriented, awake and alert and awake Pain management: pain level controlled Vital Signs Assessment: post-procedure vital signs reviewed and stable Respiratory status: spontaneous breathing, nonlabored ventilation, respiratory function stable and patient connected to nasal cannula oxygen Cardiovascular status: blood pressure returned to baseline and stable Postop Assessment: no headache, no backache and no apparent nausea or vomiting Anesthetic complications: no   No notable events documented.  Last Vitals:  Vitals:   01/16/24 0847 01/16/24 0853  BP: 111/68 104/72  Pulse: 73 68  Resp: 17 18  Temp:    SpO2: 97% 96%    Last Pain:  Vitals:   01/16/24 0838  TempSrc: Temporal  PainSc: 0-No pain                 Garnette FORBES Skillern

## 2024-01-18 ENCOUNTER — Encounter (HOSPITAL_COMMUNITY): Payer: Self-pay | Admitting: Gastroenterology

## 2024-01-19 ENCOUNTER — Other Ambulatory Visit (HOSPITAL_COMMUNITY): Payer: Self-pay

## 2024-01-19 MED ORDER — CLINDAMYCIN HCL 150 MG PO CAPS
150.0000 mg | ORAL_CAPSULE | Freq: Three times a day (TID) | ORAL | 0 refills | Status: DC
  Filled 2024-01-19: qty 30, 10d supply, fill #0

## 2024-01-26 ENCOUNTER — Telehealth: Payer: Self-pay

## 2024-01-26 ENCOUNTER — Other Ambulatory Visit: Payer: Self-pay

## 2024-01-26 DIAGNOSIS — D125 Benign neoplasm of sigmoid colon: Secondary | ICD-10-CM

## 2024-01-26 DIAGNOSIS — D3A026 Benign carcinoid tumor of the rectum: Secondary | ICD-10-CM

## 2024-01-26 NOTE — Telephone Encounter (Signed)
 Gastroenterology Pre-Procedure Review  Request Date: 07/13/23 Requesting Physician: Dr. Jinny  PATIENT REVIEW QUESTIONS: The patient responded to the following health history questions as indicated:    1. Are you having any GI issues? no 2. Do you have a personal history of Polyps? yes (Previous hx of rectal cancer) 3. Do you have a family history of Colon Cancer or Polyps? no 4. Diabetes Mellitus? no 5. Joint replacements in the past 12 months?no 6. Major health problems in the past 3 months?no 7. Any artificial heart valves, MVP, or defibrillator?no    MEDICATIONS & ALLERGIES:    Patient reports the following regarding taking any anticoagulation/antiplatelet therapy:   Plavix, Coumadin, Eliquis, Xarelto, Lovenox, Pradaxa, Brilinta, or Effient? no Aspirin? no  Patient confirms/reports the following medications:  Current Outpatient Medications  Medication Sig Dispense Refill   clindamycin  (CLEOCIN ) 150 MG capsule Take 1 capsule (150 mg total) by mouth 3 (three) times daily until gone 30 capsule 0   fluorouracil  (EFUDEX ) 5 % cream Apply to the both  temples twice a day for 2 weeks at a time as tolerated. 40 g 1   Fluticasone  Propionate (XHANCE ) 93 MCG/ACT EXHU Place 1 spray into both nostrils 2 (two) times daily.     Fluticasone  Propionate (XHANCE ) 93 MCG/ACT EXHU Place 1 spray into both nostrils 2 (two) times daily. (Patient not taking: Reported on 09/13/2023) 16 mL 11   gabapentin  (NEURONTIN ) 100 MG capsule Take 1-3 capsules (100-300 mg total) by mouth at bedtime. (Patient not taking: Reported on 09/13/2023) 90 capsule 11   gabapentin  (NEURONTIN ) 100 MG capsule Take 1-3 capsules (100-300 mg total) by mouth at bedtime. 90 capsule 11   methylPREDNISolone  (MEDROL  DOSEPAK) 4 MG TBPK tablet Take as directed per package instructions. (Patient not taking: Reported on 09/13/2023) 21 tablet 0   mupirocin  ointment (BACTROBAN ) 2 % Apply 1 Application topically 3 (three) times daily for 7 days. (Patient  not taking: Reported on 09/13/2023) 22 g 0   mupirocin  ointment (BACTROBAN ) 2 % Apply topically 3 (three) times daily for 7 days (Patient not taking: Reported on 09/13/2023) 22 g 0   predniSONE  (DELTASONE ) 10 MG tablet Take 1 tablet (10 mg total) by mouth daily. (Patient not taking: Reported on 03/16/2023) 10 tablet 0   No current facility-administered medications for this visit.    Patient confirms/reports the following allergies:  Allergies[1]  No orders of the defined types were placed in this encounter.   AUTHORIZATION INFORMATION Primary Insurance: 1D#: Group #:  Secondary Insurance: 1D#: Group #:  SCHEDULE INFORMATION: Date: 07/13/23 Time: Location: [facility]    [1] No Known Allergies

## 2024-02-19 ENCOUNTER — Encounter: Payer: Self-pay | Admitting: Oncology

## 2024-02-20 ENCOUNTER — Other Ambulatory Visit: Payer: Self-pay

## 2024-02-20 ENCOUNTER — Encounter: Payer: Self-pay | Admitting: Oncology

## 2024-02-20 ENCOUNTER — Other Ambulatory Visit (HOSPITAL_COMMUNITY): Payer: Self-pay

## 2024-02-20 MED ORDER — LEVOFLOXACIN 500 MG PO TABS
500.0000 mg | ORAL_TABLET | Freq: Every day | ORAL | 0 refills | Status: AC
  Filled 2024-02-20 (×2): qty 7, 7d supply, fill #0

## 2024-02-22 ENCOUNTER — Encounter: Payer: Self-pay | Admitting: Oncology

## 2024-03-15 ENCOUNTER — Encounter: Payer: Self-pay | Admitting: Oncology

## 2024-03-15 ENCOUNTER — Inpatient Hospital Stay: Admitting: Oncology

## 2024-03-15 ENCOUNTER — Inpatient Hospital Stay

## 2024-03-15 VITALS — BP 115/81 | HR 79 | Temp 97.5°F | Ht 70.0 in | Wt 199.0 lb

## 2024-03-15 VITALS — BP 116/79 | HR 71 | Temp 97.9°F | Resp 14

## 2024-03-15 DIAGNOSIS — M3130 Wegener's granulomatosis without renal involvement: Secondary | ICD-10-CM

## 2024-03-15 MED ORDER — ACETAMINOPHEN 325 MG PO TABS
650.0000 mg | ORAL_TABLET | Freq: Once | ORAL | Status: AC
  Administered 2024-03-15: 650 mg via ORAL
  Filled 2024-03-15: qty 2

## 2024-03-15 MED ORDER — SODIUM CHLORIDE 0.9 % IV SOLN
500.0000 mg | Freq: Once | INTRAVENOUS | Status: AC
  Administered 2024-03-15: 500 mg via INTRAVENOUS
  Filled 2024-03-15: qty 50

## 2024-03-15 MED ORDER — DIPHENHYDRAMINE HCL 25 MG PO TABS
25.0000 mg | ORAL_TABLET | Freq: Once | ORAL | Status: AC
  Administered 2024-03-15: 25 mg via ORAL
  Filled 2024-03-15: qty 1

## 2024-03-15 MED ORDER — SODIUM CHLORIDE 0.9 % IV SOLN
Freq: Once | INTRAVENOUS | Status: AC
  Filled 2024-03-15: qty 250

## 2024-03-15 MED ORDER — DEXAMETHASONE 4 MG PO TABS
20.0000 mg | ORAL_TABLET | Freq: Once | ORAL | Status: AC
  Administered 2024-03-15: 20 mg via ORAL
  Filled 2024-03-15: qty 5

## 2024-03-15 NOTE — Progress Notes (Signed)
 Patient states no concerns at this time.

## 2024-03-15 NOTE — Patient Instructions (Addendum)
 Rituximab Injection What is this medication? RITUXIMAB (ri TUX i mab) treats leukemia and lymphoma. It works by blocking a protein that causes cancer cells to grow and multiply. This helps to slow or stop the spread of cancer cells. It may also be used to treat autoimmune conditions, such as arthritis. It works by slowing down an overactive immune system. It is a monoclonal antibody. This medicine may be used for other purposes; ask your health care provider or pharmacist if you have questions. COMMON BRAND NAME(S): RIABNI, Rituxan, RUXIENCE, truxima What should I tell my care team before I take this medication? They need to know if you have any of these conditions: Chest pain Heart disease Immune system problems Infection, such as chickenpox, cold sores, hepatitis B, herpes Irregular heartbeat or rhythm Kidney disease Low blood counts, such as low white cells, platelets, red cells Lung disease Recent or upcoming vaccine An unusual or allergic reaction to rituximab, other medications, foods, dyes, or preservatives Pregnant or trying to get pregnant Breast-feeding How should I use this medication? This medication is injected into a vein. It is given by a care team in a hospital or clinic setting. A special MedGuide will be given to you before each treatment. Be sure to read this information carefully each time. Talk to your care team about the use of this medication in children. While this medication may be prescribed for children as young as 6 months for selected conditions, precautions do apply. Overdosage: If you think you have taken too much of this medicine contact a poison control center or emergency room at once. NOTE: This medicine is only for you. Do not share this medicine with others. What if I miss a dose? Keep appointments for follow-up doses. It is important not to miss your dose. Call your care team if you are unable to keep an appointment. What may interact with this  medication? Do not take this medication with any of the following: Live vaccines This medication may also interact with the following: Cisplatin This list may not describe all possible interactions. Give your health care provider a list of all the medicines, herbs, non-prescription drugs, or dietary supplements you use. Also tell them if you smoke, drink alcohol, or use illegal drugs. Some items may interact with your medicine. What should I watch for while using this medication? Your condition will be monitored carefully while you are receiving this medication. You may need blood work while taking this medication. This medication can cause serious infusion reactions. To reduce the risk your care team may give you other medications to take before receiving this one. Be sure to follow the directions from your care team. This medication may increase your risk of getting an infection. Call your care team for advice if you get a fever, chills, sore throat, or other symptoms of a cold or flu. Do not treat yourself. Try to avoid being around people who are sick. Call your care team if you are around anyone with measles, chickenpox, or if you develop sores or blisters that do not heal properly. Avoid taking medications that contain aspirin, acetaminophen, ibuprofen, naproxen, or ketoprofen unless instructed by your care team. These medications may hide a fever. This medication may cause serious skin reactions. They can happen weeks to months after starting the medication. Contact your care team right away if you notice fevers or flu-like symptoms with a rash. The rash may be red or purple and then turn into blisters or peeling of the skin.  You may also notice a red rash with swelling of the face, lips, or lymph nodes in your neck or under your arms. In some patients, this medication may cause a serious brain infection that may cause death. If you have any problems seeing, thinking, speaking, walking, or  standing, tell your care team right away. If you cannot reach your care team, urgently seek another source of medical care. Talk to your care team if you may be pregnant. Serious birth defects can occur if you take this medication during pregnancy and for 12 months after the last dose. You will need a negative pregnancy test before starting this medication. Contraception is recommended while taking this medication and for 12 months after the last dose. Your care team can help you find the option that works for you. Do not breastfeed while taking this medication and for at least 6 months after the last dose. What side effects may I notice from receiving this medication? Side effects that you should report to your care team as soon as possible: Allergic reactions or angioedema--skin rash, itching or hives, swelling of the face, eyes, lips, tongue, arms, or legs, trouble swallowing or breathing Bowel blockage--stomach cramping, unable to have a bowel movement or pass gas, loss of appetite, vomiting Dizziness, loss of balance or coordination, confusion or trouble speaking Heart attack--pain or tightness in the chest, shoulders, arms, or jaw, nausea, shortness of breath, cold or clammy skin, feeling faint or lightheaded Heart rhythm changes--fast or irregular heartbeat, dizziness, feeling faint or lightheaded, chest pain, trouble breathing Infection--fever, chills, cough, sore throat, wounds that don't heal, pain or trouble when passing urine, general feeling of discomfort or being unwell Infusion reactions--chest pain, shortness of breath or trouble breathing, feeling faint or lightheaded Kidney injury--decrease in the amount of urine, swelling of the ankles, hands, or feet Liver injury--right upper belly pain, loss of appetite, nausea, light-colored stool, dark yellow or brown urine, yellowing skin or eyes, unusual weakness or fatigue Redness, blistering, peeling, or loosening of the skin, including  inside the mouth Stomach pain that is severe, does not go away, or gets worse Tumor lysis syndrome (TLS)--nausea, vomiting, diarrhea, decrease in the amount of urine, dark urine, unusual weakness or fatigue, confusion, muscle pain or cramps, fast or irregular heartbeat, joint pain Side effects that usually do not require medical attention (report to your care team if they continue or are bothersome): Headache Joint pain Nausea Runny or stuffy nose Unusual weakness or fatigue This list may not describe all possible side effects. Call your doctor for medical advice about side effects. You may report side effects to FDA at 1-800-FDA-1088. Where should I keep my medication? This medication is given in a hospital or clinic. It will not be stored at home. NOTE: This sheet is a summary. It may not cover all possible information. If you have questions about this medicine, talk to your doctor, pharmacist, or health care provider.  2024 Elsevier/Gold Standard (2021-06-18 00:00:00)

## 2024-03-15 NOTE — Progress Notes (Signed)
 " [facility]  Telephone:([phone] Fax:([phone]) [phone]  ID: Thomas Gibbs OB: [birth year]  MR#: [id number]  RDW#:[id number]  Patient Care Team: Cleotilde Oneil FALCON, MD as PCP - General (Internal Medicine) Jacobo Evalene PARAS, MD as Consulting Physician (Oncology)  CHIEF COMPLAINT: Wegener's granulomatosis.   INTERVAL HISTORY: Patient returns to clinic today for routine 42-month evaluation and continuation of Ruxience .  He continues to feel well and remains asymptomatic.  On his most recent treatment 6 months ago, he noticed side effects to his premedications and is asking them to be switched to oral.  He otherwise is tolerating his treatments without significant side effects.  He has no neurologic complaints.  He denies any recent fevers or illnesses.  He has a good appetite and denies weight loss.  He has no chest pain, shortness of breath, cough, or hemoptysis.  He denies any nausea, vomiting, constipation, or diarrhea.  He has no urinary complaints.  Patient offers no specific complaints today.  REVIEW OF SYSTEMS:   Review of Systems  Constitutional: Negative.  Negative for fever, malaise/fatigue and weight loss.  Respiratory: Negative.  Negative for cough, hemoptysis and shortness of breath.   Cardiovascular:  Negative for chest pain and leg swelling.  Gastrointestinal: Negative.  Negative for abdominal pain.  Genitourinary: Negative.  Negative for dysuria.  Musculoskeletal: Negative.  Negative for back pain.  Skin: Negative.  Negative for rash.  Neurological: Negative.  Negative for dizziness, focal weakness, weakness and headaches.  Psychiatric/Behavioral: Negative.  The patient is not nervous/anxious.     As per HPI. Otherwise, a complete review of systems is negative.  PAST MEDICAL HISTORY: Past Medical History:  Diagnosis Date   BMI 28.0-28.9,adult    Headache    sinus   Wegener's granulomatosis (granulomatosis with polyangiitis)     PAST SURGICAL  HISTORY: Past Surgical History:  Procedure Laterality Date   BIOPSY  01/06/2022   Procedure: BIOPSY;  Surgeon: Wilhelmenia, Aloha Raddle., MD;  Location: [facility];  Service: Gastroenterology;;   COLONOSCOPY N/A 01/16/2024   Procedure: COLONOSCOPY;  Surgeon: Wilhelmenia Aloha Raddle., MD;  Location: [facility];  Service: Gastroenterology;  Laterality: N/A;   COLONOSCOPY WITH PROPOFOL  N/A 09/16/2020   Procedure: COLONOSCOPY WITH PROPOFOL ;  Surgeon: Jinny Carmine, MD;  Location: [facility] END[facility]COPY;  Service: Endoscopy;  Laterality: N/A;   ENDOSCOPIC MUCOSAL RESECTION N/A 10/30/2020   Procedure: ENDOSCOPIC MUCOSAL RESECTION;  Surgeon: Wilhelmenia Aloha Raddle., MD;  Location: [facility] END[facility]COPY;  Service: Gastroenterology;  Laterality: N/A;   ETHMOIDECTOMY Bilateral 11/09/2018   Procedure: Total ETHMOIDECTOMY with sphenoidotomy-Left Total Ethmoidectomy with Sphenoidectomy (Tissue Removal)-Right;  Surgeon: Edda Mt, MD;  Location: [facility];  Service: ENT;  Laterality: Bilateral;   EUS N/A 10/30/2020   Procedure: LOWER ENDOSCOPIC ULTRASOUND (EUS);  Surgeon: Wilhelmenia Aloha Raddle., MD;  Location: [facility] END[facility]COPY;  Service: Gastroenterology;  Laterality: N/A;   EUS N/A 01/06/2022   Procedure: LOWER ENDOSCOPIC ULTRASOUND (EUS);  Surgeon: Wilhelmenia Aloha Raddle., MD;  Location: [facility];  Service: Gastroenterology;  Laterality: N/A;   EUS N/A 01/16/2024   Procedure: ULTRASOUND, LOWER GI TRACT, ENDOSCOPIC;  Surgeon: Wilhelmenia Aloha Raddle., MD;  Location: [facility];  Service: Gastroenterology;  Laterality: N/A;   FLEXIBLE SIGMOIDOSCOPY N/A 09/25/2020   Procedure: FLEXIBLE SIGMOIDOSCOPY;  Surgeon: Jinny Carmine, MD;  Location: [facility] ENDOSCOPY;  Service: Endoscopy;  Laterality: N/A;   FLEXIBLE SIGMOIDOSCOPY N/A 10/30/2020   Procedure: FLEXIBLE SIGMOIDOSCOPY;  Surgeon: Wilhelmenia Aloha Raddle., MD;  Location: [facility] END[facility];  Service: Gastroenterology;  Laterality: N/A;  FLEXIBLE SIGMOIDOSCOPY N/A 01/06/2022    Procedure: FLEXIBLE SIGMOIDOSCOPY;  Surgeon: Wilhelmenia Aloha Raddle., MD;  Location: [facility];  Service: Gastroenterology;  Laterality: N/A;   FRONTAL SINUS EXPLORATION Bilateral 11/09/2018   Procedure: FRONTAL SINUSotomy;  Surgeon: Edda Mt, MD;  Location: [facility];  Service: ENT;  Laterality: Bilateral;   HEMOSTASIS CLIP PLACEMENT  10/30/2020   Procedure: HEMOSTASIS CLIP PLACEMENT;  Surgeon: Wilhelmenia Aloha Raddle., MD;  Location: [facility] END[facility]COPY;  Service: Gastroenterology;;   IMAGE GUIDED SINUS SURGERY Bilateral 11/09/2018   Procedure: IMAGE GUIDED SINUS SURGERY;  Surgeon: Edda Mt, MD;  Location: [facility];  Service: ENT;  Laterality: Bilateral;  NEED STRYKER DISK   LUNG BIOPSY     MAXILLARY ANTROSTOMY Bilateral 11/09/2018   Procedure: MAXILLARY ANTROSTOMY with removal of tissue;  Surgeon: Edda Mt, MD;  Location: [facility];  Service: ENT;  Laterality: Bilateral;   SEPTOPLASTY Bilateral 11/09/2018   Procedure: SEPTOPLASTY;  Surgeon: Edda Mt, MD;  Location: [facility];  Service: ENT;  Laterality: Bilateral;  Gave disk to Erminio 9-30   SUBMUCOSAL LIFTING INJECTION  10/30/2020   Procedure: SUBMUCOSAL LIFTING INJECTION;  Surgeon: Wilhelmenia Aloha Raddle., MD;  Location: [facility] ENDOSCOPY;  Service: Gastroenterology;;   MONTA REDUCTION Bilateral 11/09/2018   Procedure: MONTA REDUCTION-inferior;  Surgeon: Edda Mt, MD;  Location: [facility];  Service: ENT;  Laterality: Bilateral;   VIDEO BRONCHOSCOPY WITH ENDOBRONCHIAL NAVIGATION N/A 12/22/2018   Procedure: VIDEO BRONCHOSCOPY WITH ENDOBRONCHIAL NAVIGATION;  Surgeon: Parris Manna, MD;  Location: [facility] ORS;  Service: Thoracic;  Laterality: N/A;   WISDOM TOOTH EXTRACTION      FAMILY HISTORY: History reviewed. No pertinent family history.  ADVANCED DIRECTIVES (Y/N):  N  HEALTH MAINTENANCE: Social History   Tobacco Use   Smoking status: Former    Current packs/day: 0.00     Types: Cigarettes    Start date: 26    Quit date: 2001    Years since quitting: 25.1   Smokeless tobacco: Former    Types: Chew    Quit date: 12/18/2017  Vaping Use   Vaping status: Never Used  Substance Use Topics   Alcohol use: Not Currently    Alcohol/week: 3.0 standard drinks of alcohol    Types: 3 Cans of beer per week    Comment: social   Drug use: Never     Colonoscopy:  PAP:  Bone density:  Lipid panel:  No Known Allergies  Current Outpatient Medications  Medication Sig Dispense Refill   gabapentin  (NEURONTIN ) 100 MG capsule Take 1-3 capsules (100-300 mg total) by mouth at bedtime. 90 capsule 11   fluorouracil  (EFUDEX ) 5 % cream Apply to the both  temples twice a day for 2 weeks at a time as tolerated. 40 g 1   Fluticasone  Propionate (XHANCE ) 93 MCG/ACT EXHU Place 1 spray into both nostrils 2 (two) times daily. (Patient not taking: Reported on 03/15/2024)     Fluticasone  Propionate (XHANCE ) 93 MCG/ACT EXHU Place 1 spray into both nostrils 2 (two) times daily. (Patient not taking: Reported on 03/15/2024) 16 mL 11   gabapentin  (NEURONTIN ) 100 MG capsule Take 1-3 capsules (100-300 mg total) by mouth at bedtime. (Patient not taking: Reported on 03/15/2024) 90 capsule 11   methylPREDNISolone  (MEDROL  DOSEPAK) 4 MG TBPK tablet Take as directed per package instructions. (Patient not taking: Reported on 03/15/2024) 21 tablet 0   mupirocin  ointment (BACTROBAN ) 2 % Apply 1 Application topically 3 (three) times daily for 7 days. (Patient not taking: Reported on  03/15/2024) 22 g 0   mupirocin  ointment (BACTROBAN ) 2 % Apply topically 3 (three) times daily for 7 days (Patient not taking: Reported on 03/15/2024) 22 g 0   predniSONE  (DELTASONE ) 10 MG tablet Take 1 tablet (10 mg total) by mouth daily. (Patient not taking: Reported on 03/15/2024) 10 tablet 0   No current facility-administered medications for this visit.    OBJECTIVE: Vitals:   03/15/24 0831  BP: 115/81  Pulse: 79  Temp: (!)  97.5 F (36.4 C)  SpO2: 98%     Body mass index is 28.55 kg/m.    ECOG FS:0 - Asymptomatic  General: Well-developed, well-nourished, no acute distress. Eyes: Pink conjunctiva, anicteric sclera. HEENT: Normocephalic, moist mucous membranes. Lungs: No audible wheezing or coughing. Heart: Regular rate and rhythm. Abdomen: Soft, nontender, no obvious distention. Musculoskeletal: No edema, cyanosis, or clubbing. Neuro: Alert, answering all questions appropriately. Cranial nerves grossly intact. Skin: No rashes or petechiae noted. Psych: Normal affect.  LAB RESULTS:  No results found for: NA, K, CL, CO2, GLUCOSE, BUN, CREATININE, CALCIUM, PROT, ALBUMIN, AST, ALT, ALKPHOS, BILITOT, GFRNONAA, GFRAA  Lab Results  Component Value Date   WBC 10.2 12/19/2018   HGB 12.4 (L) 12/19/2018   HCT 39.5 12/19/2018   MCV 92.7 12/19/2018   PLT 400 12/19/2018     STUDIES: No results found.  ASSESSMENT: Wegener's granulomatosis.   PLAN:   Wegener's granulomatosis: Proceed with 500 mg maintenance IV Ruxience  today.  Patient expressed interest in switching his premedications to oral.  He also understands that all laboratory work and any symptoms related to his Wegener's will be monitored and evaluated by Dr. Tobie in Rheumatology.  No further intervention is needed.  Proceed with treatment today.  Return to clinic in 6 months for further evaluation and continuation of treatment.  Follow-up with rheumatology as scheduled.  I spent a total of 30 minutes reviewing chart data, face-to-face evaluation with the patient, counseling and coordination of care as detailed above.    Patient expressed understanding and was in agreement with this plan. He also understands that He can call clinic at any time with any questions, concerns, or complaints.    Evalene JINNY Reusing, MD   03/15/2024 8:47 AM     "

## 2024-07-12 ENCOUNTER — Ambulatory Visit: Admit: 2024-07-12 | Payer: Self-pay | Admitting: Gastroenterology

## 2024-07-12 SURGERY — COLONOSCOPY
Anesthesia: Choice

## 2024-09-17 ENCOUNTER — Inpatient Hospital Stay: Admitting: Oncology

## 2024-09-17 ENCOUNTER — Inpatient Hospital Stay
